# Patient Record
Sex: Female | Born: 1960 | Race: White | Hispanic: No | Marital: Married | Smoking: Former smoker
Health system: Southern US, Community
[De-identification: ages and names within clinical notes are randomized; demographics above are authoritative.]

## PROBLEM LIST (undated history)

## (undated) DIAGNOSIS — N390 Urinary tract infection, site not specified: Secondary | ICD-10-CM

## (undated) DIAGNOSIS — F329 Major depressive disorder, single episode, unspecified: Secondary | ICD-10-CM

## (undated) DIAGNOSIS — F32A Depression, unspecified: Secondary | ICD-10-CM

## (undated) DIAGNOSIS — K219 Gastro-esophageal reflux disease without esophagitis: Secondary | ICD-10-CM

## (undated) DIAGNOSIS — E785 Hyperlipidemia, unspecified: Secondary | ICD-10-CM

## (undated) HISTORY — DX: Gastro-esophageal reflux disease without esophagitis: K21.9

## (undated) HISTORY — DX: Major depressive disorder, single episode, unspecified: F32.9

## (undated) HISTORY — DX: Hyperlipidemia, unspecified: E78.5

## (undated) HISTORY — DX: Depression, unspecified: F32.A

---

## 1986-07-23 HISTORY — PX: BIOPSY BREAST: PRO8

## 2000-07-23 HISTORY — PX: CHOLECYSTECTOMY: SHX55

## 2014-01-16 ENCOUNTER — Encounter (HOSPITAL_COMMUNITY): Payer: Self-pay | Admitting: Emergency Medicine

## 2014-01-16 ENCOUNTER — Emergency Department (INDEPENDENT_AMBULATORY_CARE_PROVIDER_SITE_OTHER)
Admission: EM | Admit: 2014-01-16 | Discharge: 2014-01-16 | Disposition: A | Discharge status: Home / Self Care | Payer: BC Managed Care – PPO | Source: Home / Self Care | Attending: Emergency Medicine | Admitting: Emergency Medicine

## 2014-01-16 DIAGNOSIS — M533 Sacrococcygeal disorders, not elsewhere classified: Secondary | ICD-10-CM

## 2014-01-16 HISTORY — DX: Urinary tract infection, site not specified: N39.0

## 2014-01-16 MED ORDER — PREDNISONE 10 MG PO TABS: ORAL_TABLET | ORAL | Status: DC

## 2014-01-16 MED ORDER — METHYLPREDNISOLONE ACETATE 80 MG/ML IJ SUSP
INTRAMUSCULAR | Status: AC
  Filled 2014-01-16: qty 1

## 2014-01-16 MED ORDER — METHYLPREDNISOLONE ACETATE 80 MG/ML IJ SUSP
80.0000 mg | Freq: Once | INTRAMUSCULAR | Status: AC
  Administered 2014-01-16: 80 mg via INTRAMUSCULAR

## 2014-01-16 MED ORDER — HYDROCODONE-ACETAMINOPHEN 5-325 MG PO TABS: ORAL_TABLET | ORAL | Status: DC

## 2014-01-16 MED ORDER — DICLOFENAC SODIUM 75 MG PO TBEC: 75.0000 mg | DELAYED_RELEASE_TABLET | Freq: Two times a day (BID) | ORAL | Status: DC

## 2014-01-16 NOTE — ED Notes (Signed)
Denies injury.  C/O coccyx pain x 2 days with progressive worsening.  Has been sitting more than usual.  States moving from sitting to standing position is excruciating.  Has been applying heat and taking IBU.  Having difficulty sleeping due to pain.

## 2014-01-16 NOTE — Discharge Instructions (Signed)
Tailbone Injury  The tailbone (coccyx) is the small bone at the lower end of the spine. A tailbone injury may involve stretched ligaments, bruising, or a broken bone (fracture). Women are more vulnerable to this injury due to having a wider pelvis.  CAUSES   This type of injury typically occurs from falling and landing on the tailbone. Repeated strain or friction from actions such as rowing and bicycling may also injure the area. The tailbone can be injured during childbirth. Infections or tumors may also press on the tailbone and cause pain. Sometimes, the cause of injury is unknown.  SYMPTOMS    Bruising.   Pain when sitting.   Painful bowel movements.   In women, pain during intercourse.  DIAGNOSIS   Your caregiver can diagnose a tailbone injury based on your symptoms and a physical exam. X-rays may be taken if a fracture is suspected. Your caregiver may also use an MRI scan imaging test to evaluate your symptoms.  TREATMENT   Your caregiver may prescribe medicines to help relieve your pain. Most tailbone injuries heal on their own in 4 to 6 weeks. However, if the injury is caused by an infection or tumor, the recovery period may vary.  PREVENTION   Wear appropriate padding and sports gear when bicycling and rowing. This can help prevent an injury from repeated strain or friction.  HOME CARE INSTRUCTIONS    Put ice on the injured area.   Put ice in a plastic bag.   Place a towel between your skin and the bag.   Leave the ice on for 15-20 minutes, every hour while awake for the first 1 to 2 days.   Sit on a large, rubber or inflated ring or cushion to ease your pain. Lean forward when sitting to help decrease discomfort.   Avoid sitting for long periods of time.   Increase your activity as the pain allows.   Only take over-the-counter or prescription medicines for pain, discomfort, or fever as directed by your caregiver.   You may use stool softeners if it is painful to have a bowel movement, or as  directed by your caregiver.   Eat a diet with plenty of fiber to help prevent constipation.   Keep all follow-up appointments as directed by your caregiver.  SEEK MEDICAL CARE IF:    Your pain becomes worse.   Your bowel movements cause a great deal of discomfort.   You are unable to have a bowel movement.   You have a fever.  MAKE SURE YOU:   Understand these instructions.   Will watch your condition.   Will get help right away if you are not doing well or get worse.  Document Released: 07/06/2000 Document Revised: 10/01/2011 Document Reviewed: 02/01/2011  ExitCare Patient Information 2015 [employer]. This information is not intended to replace advice given to you by your health care provider. Make sure you discuss any questions you have with your health care provider.

## 2014-01-16 NOTE — ED Provider Notes (Signed)
Chief Complaint   Chief Complaint  Patient presents with  . Tailbone Pain    History of Present Illness   Noreene LarssonJanet Adinolfi is a 53 year old female has had a two-day history of pain in her lower back localized around the coccyx area. She denies any injury to this area. There is no swelling or erythema. No palpable mass. The pain radiates out to either side but not down the legs. There is no numbness, tingling, weakness in lower extremities. She denies any urinary symptoms, bladder or bowel dysfunction, or saddle anesthesia. The pain is worse with sitting. She gets relief if she sits on a pillow. She does have some pain with bending but not severe. She denies any abdominal pain, nausea, or vomiting. She's had no fever, chills, or weight loss.  Review of Systems   Other than as noted above, the patient denies any of the following symptoms: Systemic:  No fever, chills, or unexplained weight loss. GI:  No abdominal painor incontinence of bowel. GU:  No dysuria, frequency, urgency, or hematuria. No incontinence of urine or urinary retention.  M-S:  No neck pain or arthritis. Neuro:  No paresthesias, headache, saddle anesthesia, muscular weakness, or progressive neurological deficit.  PMFSH   Past medical history, family history, social history, meds, and allergies were reviewed. Specifically, there is no history of cancer, major trauma, osteoporosis, immunosuppression, or HIV infection.   Physical Examination    Vital signs:  BP 131/65  Pulse 72  Temp(Src) 98.8 F (37.1 C) (Oral)  Resp 16  SpO2 100% General:  Alert, oriented, in no distress. Abdomen:  Soft, non-tender.  No organomegaly or mass.  No pulsatile midline abdominal mass or bruit. Back:  There is pain to palpation over the coccyx. No swelling or erythema. No evidence of an abscess. Her back has a full range of motion with slight pain on forward bending. Straight leg raising is negative. Neuro:  Normal muscle strength, sensations  and DTRs. Extremities: Pedal pulses were full, there was no edema. Skin:  Clear, warm and dry.  No rash.  Assessment   The encounter diagnosis was Coccygodynia.  No evidence of cauda equina syndrome, discitis, epidural abscess, or aneurism.    Plan     1.  Meds:  The following meds were prescribed:   Discharge Medication List as of 01/16/2014  6:31 PM    START taking these medications   Details  diclofenac (VOLTAREN) 75 MG EC tablet Take 1 tablet (75 mg total) by mouth 2 (two) times daily., Starting 01/16/2014, Until Discontinued, Normal    HYDROcodone-acetaminophen (NORCO/VICODIN) 5-325 MG per tablet 1 to 2 tabs every 4 to 6 hours as needed for pain., Print    predniSONE (DELTASONE) 10 MG tablet Take 4 tabs daily for 4 days, 3 tabs daily for 4 days, 2 tabs daily for 4 days, then 1 tab daily for 4 days., Normal        2.  Patient Education/Counseling:  The patient was given appropriate handouts, self care instructions, and instructed in symptomatic relief. The patient was encouraged to try to be as active as possible and given some exercises to do followed by moist heat. Suggested sitting on a foam rubber donut or inflatable doughnut or a soft pillow. Followup with Dr. Magnus IvanBlackman if no better by next week.  3.  Follow up:  The patient was told to follow up here if no better in 3 to 4 days, or sooner if becoming worse in any way, and given some  red flag symptoms such as worsening pain or new neurological symptoms which would prompt immediate return.      Reuben Likesavid C Keller, MD 01/16/14 [phone]

## 2014-04-14 ENCOUNTER — Ambulatory Visit: Payer: BC Managed Care – PPO

## 2014-04-15 ENCOUNTER — Ambulatory Visit (INDEPENDENT_AMBULATORY_CARE_PROVIDER_SITE_OTHER): Payer: BC Managed Care – PPO | Admitting: Emergency Medicine

## 2014-04-15 VITALS — BP 125/79 | HR 72 | Temp 97.9°F | Resp 16 | Ht 64.0 in | Wt 154.2 lb

## 2014-04-15 DIAGNOSIS — L240 Irritant contact dermatitis due to detergents: Secondary | ICD-10-CM

## 2014-04-15 MED ORDER — TRIAMCINOLONE ACETONIDE 0.1 % EX CREA: 1.0000 "application " | TOPICAL_CREAM | Freq: Two times a day (BID) | CUTANEOUS | Status: DC

## 2014-04-15 NOTE — Progress Notes (Signed)
[facility] [address], [place] [address] [phone]  Date:  04/15/2014   Name:  Angela Pittman   DOB:  [birth year]   MRN:  [id number]  PCP:  No PCP Per Patient    Chief Complaint: Rash and Edema   History of Present Illness:  Angela Pittman is a 53 y.o. very pleasant female patient who presents with the following:  Over the past two weeks she has been exposed to two types of gloves that she wears intermittently.   She uses a detergent and a skin sanitizer that seh is intermittently exposed to . She has now red vesicular rash on the dorsum of the hands and minimally on the cheeks No systemic allergic symptoms. No improvement with over the counter medications or other home remedies.      There are no active problems to display for this patient.   Past Medical History  Diagnosis Date  . UTI (lower urinary tract infection)     Past Surgical History  Procedure Laterality Date  . Cholecystectomy      History  Substance Use Topics  . Smoking status: Never Smoker   . Smokeless tobacco: Not on file  . Alcohol Use: No    No family history on file.  Allergies  Allergen Reactions  . Ciprofloxacin   . Sulfa Antibiotics     Medication list has been reviewed and updated.  No current outpatient prescriptions on file prior to visit.   No current facility-administered medications on file prior to visit.    Review of Systems:  As per HPI, otherwise negative.    Physical Examination: Filed Vitals:   04/15/14 0950  BP: 125/79  Pulse: 72  Temp: 97.9 F (36.6 C)  Resp: 16   Filed Vitals:   04/15/14 0950  Height:  (1.626 m)  Weight: 154 lb 3.2 oz (69.945 kg)   Body mass index is 26.46 kg/(m^2). Ideal Body Weight: Weight in (lb) to have BMI = 25: 145.3   GEN: WDWN, NAD, Non-toxic, Alert & Oriented x 3 HEENT: Atraumatic, Normocephalic.  Ears and Nose: No external deformity. EXTR: No clubbing/cyanosis/edema NEURO: Normal  gait.  PSYCH: Normally interactive. Conversant. Not depressed or anxious appearing.  Calm demeanor.  SKIN:  Bilateral dorsal erythematous vesicular edematous eruption on the hands.   Some erythema on the face.  Assessment and Plan: Contact dermatitis from contact with a new product Elimination plan TAC  Signed,  Phillips Odor, MD

## 2014-04-15 NOTE — Patient Instructions (Signed)

## 2014-10-04 HISTORY — PX: BUNIONECTOMY: SHX129

## 2014-10-05 ENCOUNTER — Ambulatory Visit: Payer: BC Managed Care – PPO | Admitting: Internal Medicine

## 2014-11-10 ENCOUNTER — Ambulatory Visit: Payer: Self-pay | Admitting: Internal Medicine

## 2014-12-23 ENCOUNTER — Ambulatory Visit (INDEPENDENT_AMBULATORY_CARE_PROVIDER_SITE_OTHER): Payer: Managed Care, Other (non HMO) | Admitting: Internal Medicine

## 2014-12-23 ENCOUNTER — Encounter: Payer: Self-pay | Admitting: Internal Medicine

## 2014-12-23 VITALS — BP 110/70 | Temp 98.3°F | Ht 63.75 in | Wt 135.4 lb

## 2014-12-23 DIAGNOSIS — F32A Depression, unspecified: Secondary | ICD-10-CM | POA: Insufficient documentation

## 2014-12-23 DIAGNOSIS — K219 Gastro-esophageal reflux disease without esophagitis: Secondary | ICD-10-CM | POA: Diagnosis not present

## 2014-12-23 DIAGNOSIS — F329 Major depressive disorder, single episode, unspecified: Secondary | ICD-10-CM

## 2014-12-23 DIAGNOSIS — E785 Hyperlipidemia, unspecified: Secondary | ICD-10-CM | POA: Insufficient documentation

## 2014-12-23 DIAGNOSIS — Z1211 Encounter for screening for malignant neoplasm of colon: Secondary | ICD-10-CM | POA: Insufficient documentation

## 2014-12-23 NOTE — Progress Notes (Signed)
Pre visit review using our clinic review tool, if applicable. No additional management support is needed unless otherwise documented below in the visit note.  Chief Complaint  Patient presents with  . Establish Care    HPI: Patient  Angela Pittman  54 y.o. married female previously from [place] he was moved in the last year who comes in today for new patient Health Care visit  PCP and health care team : Previously was in [place]. She had an OB/GYN their psychiatrist and psychologist. She recently had a bunion surgery in [place] recovering since March. She generally feels well and has a couple of medical conditions that require management  Sleep : melatonin   use difficulties  taking Ambien because gets side effects sleep disruption possibly related to the surgery.   Nanny and mange ing. Moved  From [place]  LIPIDS :   Lost weight  And on med . For this her original cholesterol was About 250  obgyne amy groff.  In [place] we'll need to set up a new one concern about breast cancer risk Sis  Passed age 60  Mgm  so died of breast cancer some cousins had early breast cancer Depression : was seeing someone in [place]  Last seen 8  Months ago  On wellbutrin helps .  Counselor and  Medication.   Gets yearly skin check. GERD takes Protonix mostly once a day although it is written twice a day had her esophagus stretched a number of years ago has been on this medicine for about 10 years. Wonders if she needs another endoscopy. Health Maintenance  Topic Date Due  . TETANUS/TDAP  04/15/1979  . HIV Screening  12/22/2015 (Originally 04/15/1975)  . INFLUENZA VACCINE  02/21/2015  . COLONOSCOPY  07/24/2015  . MAMMOGRAM  08/26/2016  . PAP SMEAR  08/26/2017   Health Maintenance Review LIFESTYLE:  Exercise:  Does usually yoga and is active but less since her foot surgery Tobacco/ETS: Emote history 70 years Alcohol: No  Sugar beverages: Avoids Sleep: X hours Drug use: no Colonoscopy: Due for  this PAP: Up-to-date 2 Dec 21, 2014 gravida 1 para 0 MAMMO: 2 12-21-2014   ROS:  GEN/ HEENT: No fever, significant weight changes sweats headaches vision problems hearing changes, CV/ PULM; No chest pain shortness of breath cough, syncope,edema  change in exercise tolerance. GI /GU: No adominal pain, vomiting, change in bowel habits. No blood in the stool. No significant GU symptoms. SKIN/HEME: ,no acute skin rashes suspicious lesions or bleeding. No lymphadenopathy, nodules, masses.  NEURO/ PSYCH:  No neurologic signs such as weakness numbness. No depression anxiety. IMM/ Allergy: No unusual infections.  Allergy .   REST of 12 system review negative except as per HPI   Past Medical History  Diagnosis Date  . UTI (lower urinary tract infection)   . Depression     on meds under care stable psych  and counserlor [place]  . 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 Hyperlipidemia     on meds and weight loss help / orig 250 and above reange by report    Past Surgical History  Procedure Laterality Date  . Cholecystectomy  2000/12/20  . Bunionectomy  10/04/14  . Biopsy breast  1988    Family History  Problem Relation Age of Onset  . Breast cancer Sister     Deceased 12/20/04 age 80  . Lung cancer Father     mets to brain  .  Hyperlipidemia Mother   . Alzheimer's disease Mother   . Heart disease Father     Heart attack  . Hypertension Mother   . Hypertension Brother     History   Social History  . Marital Status: Married    Spouse Name: N/A  . Number of Children: N/A  . Years of Education: N/A   Social History Main Topics  . Smoking status: Former Games developer  . Smokeless tobacco: Never Used  . Alcohol Use: 0.0 oz/week    0 Standard drinks or equivalent per week  . Drug Use: No  . Sexual Activity:    Partners: Male     Comment: Husband   Other Topics Concern  . 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   Recently moved from [facility]   No Children   Gravida 1 para 0    Outpatient Prescriptions Prior to Visit  Medication Sig Dispense Refill  . atorvastatin (LIPITOR) 40 MG tablet Take 40 mg by mouth daily.    . pantoprazole (PROTONIX) 40 MG tablet Take 40 mg by mouth 2 (two) times daily.     . valACYclovir (VALTREX) 500 MG tablet Take 500 mg by mouth 2 (two) times daily.    . BuPROPion HCl (WELLBUTRIN PO) Take 150 mg by mouth 3 (three) times daily.    Marland Kitchen triamcinolone cream (KENALOG) 0.1 % Apply 1 application topically 2 (two) times daily. 30 g 0   No facility-administered medications prior to visit.     EXAM:  BP 110/70 mmHg  Temp(Src) 98.3 F (36.8 C) (Oral)  Ht 5' 3.75" (1.619 m)  Wt 135 lb 6.4 oz (61.417 kg)  BMI 23.43 kg/m2  Body mass index is 23.43 kg/(m^2).  Physical Exam: Vital signs reviewed LOV:FIEP is a well-developed well-nourished alert cooperative    who appearsr stated age in no acute distress. Some excess motor activity looks well no tremor fidgeting. HEENT: normocephalic atraumatic , Eyes: PERRL EOM's full, conjunctiva clear, Nares: paten,t no deformity discharge or tenderness.,  NECK: supple without masses, thyromegaly or bruits. CHEST/PULM:  Clear to auscultation and percussion breath sounds equal no wheeze , rales or rhonchi. CV: PMI is nondisplaced, S1 S2 no gallops, murmurs, rubs. Peripheral pulses are full without delay.No JVD .  ABDOMEN: Bowel sounds normal nontender  No guard or rebound, no hepato splenomegal no CVA tenderness.   Extremtities:  No clubbing cyanosis or edema, no acute joint swelling or redness no focal atrophy NEURO:  Oriented x3, cranial nerves 3-12 appear to be intact, no obvious focal weakness,gait within normal limits no abnormal reflexes or asymmetrical SKIN: No acute rashes normal turgor, color, no bruising  or petechiae. PSYCH: Oriented, good eye contact, no obvious depression anxiety, cognition and judgment appear normal. LN: no cervical  adenopathy    No results found for: WBC, HGB, HCT, PLT, GLUCOSE, CHOL, TRIG, HDL, LDLDIRECT, LDLCALC, ALT, AST, NA, K, CL, CREATININE, BUN, CO2, TSH, PSA, INR, GLUF, HGBA1C, MICROALBUR  ASSESSMENT AND PLAN:  Discussed the following assessment and plan:  Gastroesophageal reflux disease, esophagitis presence not specified - On chronic PPI history of esophageal procedure refer to GI uncertain if she really needs an endoscopy or just: Screening - Plan: Ambulatory referral to Gastroenterology  Hyperlipidemia - Plan check in this near future with routine  labs  Depression - On high-dose Wellbutrin from psychiatrist to continue transfer to local care  Screen for colon cancer - Is due for this will refer - Plan: Ambulatory referral to Gastroenterology  Patient Care Team: Madelin HeadingsWanda K Panosh, MD as PCP - General (Internal Medicine) Richmond CampbellGroff, Amy (Obstetrics and Gynecology) Larey DresserMartha Ajlouny, DPM as Consulting Physician (Podiatry) Patient Instructions   will be contacted about colonoscopy screening referral. [facility], Dr. Loralie ChampagneMcKinney's office for behavioral health Advise we set you up for a preventive care visit checkup was full set of labs including cholesterol blood sugar and thyroid. No change in medicines at this time.       Neta MendsWanda K. Panosh M.D.

## 2014-12-23 NOTE — Patient Instructions (Signed)
will be contacted about colonoscopy screening referral. [facility], Dr. Loralie ChampagneMcKinney's office for behavioral health Advise we set you up for a preventive care visit checkup was full set of labs including cholesterol blood sugar and thyroid. No change in medicines at this time.

## 2015-01-03 ENCOUNTER — Telehealth: Payer: Self-pay

## 2015-01-03 NOTE — Telephone Encounter (Signed)
Done - pt. reported done on 08-26-14

## 2015-06-27 ENCOUNTER — Telehealth: Payer: Self-pay | Admitting: Internal Medicine

## 2015-06-27 ENCOUNTER — Other Ambulatory Visit (INDEPENDENT_AMBULATORY_CARE_PROVIDER_SITE_OTHER): Payer: Managed Care, Other (non HMO)

## 2015-06-27 DIAGNOSIS — Z0000 Encounter for general adult medical examination without abnormal findings: Secondary | ICD-10-CM | POA: Diagnosis not present

## 2015-06-27 LAB — TSH: TSH: 2.81 u[IU]/mL (ref 0.35–4.50)

## 2015-06-27 LAB — CBC WITH DIFFERENTIAL/PLATELET
BASOS ABS: 0 10*3/uL (ref 0.0–0.1)
Basophils Relative: 0.5 % (ref 0.0–3.0)
EOS ABS: 0.1 10*3/uL (ref 0.0–0.7)
EOS PCT: 2.5 % (ref 0.0–5.0)
HCT: 41.6 % (ref 36.0–46.0)
HEMOGLOBIN: 13.9 g/dL (ref 12.0–15.0)
Lymphocytes Relative: 35.5 % (ref 12.0–46.0)
Lymphs Abs: 1.5 10*3/uL (ref 0.7–4.0)
MCHC: 33.4 g/dL (ref 30.0–36.0)
MCV: 92.9 fl (ref 78.0–100.0)
MONO ABS: 0.4 10*3/uL (ref 0.1–1.0)
Monocytes Relative: 9.2 % (ref 3.0–12.0)
NEUTROS PCT: 52.3 % (ref 43.0–77.0)
Neutro Abs: 2.2 10*3/uL (ref 1.4–7.7)
Platelets: 334 10*3/uL (ref 150.0–400.0)
RBC: 4.48 Mil/uL (ref 3.87–5.11)
RDW: 13.2 % (ref 11.5–15.5)
WBC: 4.3 10*3/uL (ref 4.0–10.5)

## 2015-06-27 LAB — HEPATIC FUNCTION PANEL
ALK PHOS: 58 U/L (ref 39–117)
ALT: 15 U/L (ref 0–35)
AST: 18 U/L (ref 0–37)
Albumin: 4.3 g/dL (ref 3.5–5.2)
BILIRUBIN DIRECT: 0.1 mg/dL (ref 0.0–0.3)
Total Bilirubin: 0.4 mg/dL (ref 0.2–1.2)
Total Protein: 7 g/dL (ref 6.0–8.3)

## 2015-06-27 LAB — LIPID PANEL
CHOL/HDL RATIO: 3
Cholesterol: 163 mg/dL (ref 0–200)
HDL: 64.1 mg/dL (ref >39.00)
LDL CALC: 88 mg/dL (ref 0–99)
NONHDL: 99.19
Triglycerides: 55 mg/dL (ref 0.0–149.0)
VLDL: 11 mg/dL (ref 0.0–40.0)

## 2015-06-27 LAB — BASIC METABOLIC PANEL
BUN: 12 mg/dL (ref 6–23)
CALCIUM: 9.7 mg/dL (ref 8.4–10.5)
CO2: 30 mEq/L (ref 19–32)
Chloride: 104 mEq/L (ref 96–112)
Creatinine, Ser: 0.72 mg/dL (ref 0.40–1.20)
GFR: 89.32 mL/min (ref >60.00)
Glucose, Bld: 83 mg/dL (ref 70–99)
POTASSIUM: 4.9 meq/L (ref 3.5–5.1)
Sodium: 140 mEq/L (ref 135–145)

## 2015-06-27 NOTE — Telephone Encounter (Signed)
[facility] her right [facility], right hip/back for pain that comes and goes, aggravated more by standing and exercising.    Patient also needs a refill on her Atorvastatin.  She said if an authorization isn't called over it won't go through.  She thought she was seeing Dr. Fabian SharpPanosh today but her visit was for blood work.  She has a physical on December 12th  Pharmacy is [facility] on [address].

## 2015-06-27 NOTE — Telephone Encounter (Signed)
I do not see where WP has prescribed atorvastatin.  Will forward for authorization. Does she need ov for referral?

## 2015-06-27 NOTE — Telephone Encounter (Signed)
Ok to refill the atorvastatin for 90 days   She is a newer patient and this is a continuation of previous med .    Usually dont  need a referral to see ortho    But if she does  We can discuss at ov cpx .  And refer as appropriate

## 2015-06-28 MED ORDER — ATORVASTATIN CALCIUM 40 MG PO TABS: 40.0000 mg | ORAL_TABLET | Freq: Every day | ORAL | Status: DC

## 2015-06-28 NOTE — Telephone Encounter (Signed)
Pt notified that rx has been sent to the pharmacy.  Advised that referral can be discussed at her cpx on 07/04/15.  Pt stated her insurance does require authorization.  Pt also asking about lab results.  Informed her that results will be discussed during cpx visit.  No further questions at this time.

## 2015-07-04 ENCOUNTER — Encounter: Payer: Self-pay | Admitting: Internal Medicine

## 2015-07-04 ENCOUNTER — Ambulatory Visit (INDEPENDENT_AMBULATORY_CARE_PROVIDER_SITE_OTHER): Payer: Managed Care, Other (non HMO) | Admitting: Internal Medicine

## 2015-07-04 VITALS — BP 120/72 | Temp 98.4°F | Ht 64.0 in | Wt 131.0 lb

## 2015-07-04 DIAGNOSIS — F32A Depression, unspecified: Secondary | ICD-10-CM

## 2015-07-04 DIAGNOSIS — K219 Gastro-esophageal reflux disease without esophagitis: Secondary | ICD-10-CM

## 2015-07-04 DIAGNOSIS — F329 Major depressive disorder, single episode, unspecified: Secondary | ICD-10-CM | POA: Diagnosis not present

## 2015-07-04 DIAGNOSIS — Z79899 Other long term (current) drug therapy: Secondary | ICD-10-CM

## 2015-07-04 DIAGNOSIS — Z0000 Encounter for general adult medical examination without abnormal findings: Secondary | ICD-10-CM

## 2015-07-04 DIAGNOSIS — M79604 Pain in right leg: Secondary | ICD-10-CM

## 2015-07-04 DIAGNOSIS — E785 Hyperlipidemia, unspecified: Secondary | ICD-10-CM

## 2015-07-04 DIAGNOSIS — Z2821 Immunization not carried out because of patient refusal: Secondary | ICD-10-CM

## 2015-07-04 DIAGNOSIS — R1031 Right lower quadrant pain: Secondary | ICD-10-CM

## 2015-07-04 MED ORDER — PANTOPRAZOLE SODIUM 40 MG PO TBEC
40.0000 mg | DELAYED_RELEASE_TABLET | Freq: Every day | ORAL | Status: AC
Start: 2015-07-04 — End: ?

## 2015-07-04 NOTE — Progress Notes (Signed)
Pre visit review using our clinic review tool, if applicable. No additional management support is needed unless otherwise documented below in the visit note.  Chief Complaint  Patient presents with  . Annual Exam    HPI: Patient  Angela Pittman  54 y.o. comes in today for Preventive Health Care visit . gerd  taking once a day  Block on refill  Written as poss 2 per day  depressoin med Check skin see below  Right groin pain for months after  bunion surgery on left  Mom is hospice terminal alzheimers  St an congesti n cough no sob  Health Maintenance  Topic Date Due  . TETANUS/TDAP  04/15/1979  . INFLUENZA VACCINE  07/03/2016 (Originally 02/21/2015)  . Hepatitis C Screening  07/03/2016 (Originally [birth year])  . HIV Screening  07/03/2016 (Originally 04/15/1975)  . COLONOSCOPY  07/24/2015  . MAMMOGRAM  08/26/2016  . PAP SMEAR  08/26/2017   Health Maintenance Review LIFESTYLE:  Exercise:   Yoga and walk  Tobacco/ETS:  no Alcohol:   ocass  2 per week  Sugar beverages: no Sleep:  Mom in hospice  care     Some problem from hyper  Drug use: no    ROS:  Throat drainage? Clear ing no sob  Reflux per se no dysphagia  Skin left lat face  ? Check change  GEN/ HEENT: No fever, significant weight changes sweats headaches vision problems hearing changes, CV/ PULM; No chest pain shortness of breath cough, syncope,edema  change in exercise tolerance. GI /GU: No adominal pain, vomiting, change in bowel habits. No blood in the stool. No significant GU symptoms. SKIN/HEME: ,no acute skin rashes suspicious lesions or bleeding. No lymphadenopathy, nodules, masses.  NEURO/ PSYCH:  No neurologic signs such as weakness numbness. No depression anxiety. IMM/ Allergy: No unusual infections.  Allergy .   REST of 12 system review negative except as per HPI   Past Medical History  Diagnosis Date  . UTI (lower urinary tract infection)   . Depression     on meds under care stable psych  and counserlor  [place]  . 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 Hyperlipidemia     on meds and weight loss help / orig 250 and above reange by report    Past Surgical History  Procedure Laterality Date  . Cholecystectomy  06-30-01  . Bunionectomy  10/04/14  . Biopsy breast  1988    Family History  Problem Relation Age of Onset  . Breast cancer Sister     Deceased Jun 30, 2005 age 21  . Lung cancer Father     mets to brain  . Hyperlipidemia Mother   . Alzheimer's disease Mother   . Heart disease Father     Heart attack  . Hypertension Mother   . Hypertension Brother     Social History   Social History  . Marital Status: Married    Spouse Name: N/A  . Number of Children: N/A  . Years of Education: N/A   Social History Main Topics  . Smoking status: Former Games developer  . Smokeless tobacco: Never Used  . Alcohol Use: 0.0 oz/week    0 Standard drinks or equivalent per week  . Drug Use: No  . Sexual Activity:    Partners: Male     Comment: Husband   Other Topics Concern  . 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   [place] from [facility]   No Children   Gravida 1 para 0    Outpatient Prescriptions Prior to Visit  Medication Sig Dispense Refill  . atorvastatin (LIPITOR) 40 MG tablet Take 1 tablet (40 mg total) by mouth daily. 30 tablet 0  . buPROPion (WELLBUTRIN XL) 150 MG 24 hr tablet Take 3 tablets (450 mg total) by mouth daily. 90 tablet 1  . valACYclovir (VALTREX) 500 MG tablet Take 500 mg by mouth 2 (two) times daily.    . pantoprazole (PROTONIX) 40 MG tablet Take 40 mg by mouth 2 (two) times daily.      No facility-administered medications prior to visit.     EXAM:  BP 120/72 mmHg  Temp(Src) 98.4 F (36.9 C) (Oral)  Ht 5\' 4"  (1.626 m)  Wt 131 lb (59.421 kg)  BMI  22.47 kg/m2  Body mass index is 22.47 kg/(m^2).  Physical Exam: Vital signs reviewed NFA:OZHY is a well-developed well-nourished alert cooperative    who appearsr stated age in no acute distress.  HEENT: normocephalic atraumatic , Eyes: PERRL EOM's full, conjunctiva clear, Nares: paten,t no deformity discharge or tenderness., Ears: no deformity EAC's clear TMs with normal landmarks. Mouth: clear OP, no lesions, edema.  Moist mucous membranes. Dentition in adequate repair. NECK: supple without masses, thyromegaly or bruits. CHEST/PULM:  Clear to auscultation and percussion breath sounds equal no wheeze , rales or rhonchi. No chest wall deformities or tenderness.Breast: normal by inspection . No dimpling, discharge, masses, tenderness or discharge . CV: PMI is nondisplaced, S1 S2 no gallops, murmurs, rubs. Peripheral pulses are full without delay.No JVD .  ABDOMEN: Bowel sounds normal nontender  No guard or rebound, no hepato splenomegal no CVA tenderness.  No hernia. Extremtities:  No clubbing cyanosis or edema, no acute joint swelling or redness no focal atrophy groin   Left bunion wellhealed . Dec rom great toe. NEURO:  Oriented x3, cranial nerves 3-12 appear to be intact, no obvious focal weakness,gait within normal limits no abnormal reflexes or asymmetrical SKIN: No acute rashes normal turgor, color, no bruising or petechiae. Left temporal area brown freckling  No scaling or irregualrity  PSYCH: Oriented, good eye contact, no obvious depression anxiety, cognition and judgment appear normal. LN: no cervical axillary inguinal adenopathy  Lab Results  Component Value Date   WBC 4.3 06/27/2015   HGB 13.9 06/27/2015   HCT 41.6 06/27/2015   PLT 334.0 06/27/2015   GLUCOSE 83 06/27/2015   CHOL 163 06/27/2015   TRIG 55.0 06/27/2015   HDL 64.10 06/27/2015   LDLCALC 88 06/27/2015   ALT 15 06/27/2015   AST 18 06/27/2015   NA 140 06/27/2015   K 4.9 06/27/2015   CL 104 06/27/2015   CREATININE  0.72 06/27/2015   BUN 12 06/27/2015   CO2 30 06/27/2015   TSH 2.81 06/27/2015    ASSESSMENT AND PLAN:  Discussed the following assessment and plan:  Visit for preventive health examination  Hyperlipidemia  Medication management  Depression - remission  on med   Gastroesophageal reflux disease, esophagitis presence not specified  Groin pain, right - some rad to knee poss also back  worse with some position yoga no injury  - Plan: Ambulatory referral to Orthopedic Surgery  Influenza vaccination declined  Leg pain, right - ? hip or leg issue  referral ongoing no specific injury  - Plan: Ambulatory referral to Orthopedic Surgery Try flonase  For 2 weeks   For cough  Patient Care Team: Madelin Headings, MD as PCP - General (Internal Medicine) Shara Blazing (Obstetrics and Gynecology) Larey Dresser, DPM as Consulting Physician (Podiatry) Patient Instructions  Continue lifestyle intervention healthy eating and exercise . Have pharmacy contactg [place] about med  Will refer for groiin knee leg pain  As discussed to [place]  conact [place] if need  To rerefer for colonoscopy.   Blood tests are good and normal today  ROV in 6 months for med check     Health Maintenance, Female Adopting a healthy lifestyle and getting preventive care can go a long way to promote health and wellness. Talk with your health care provider about what schedule of regular examinations is right for you. This is a good chance for you to check in with your provider about disease prevention and staying healthy. In between checkups, there are plenty of things you can do on your own. Experts have done a lot of research about which lifestyle changes and preventive measures are most likely to keep you healthy. Ask your health care provider for more information. WEIGHT AND DIET  Eat a healthy diet  Be sure to include plenty of vegetables, fruits, low-fat dairy products, and lean protein.  Do not eat a lot of foods high in solid  fats, added sugars, or salt.  Get regular exercise. This is one of the most important things you can do for your health.  Most adults should exercise for at least 150 minutes each week. The exercise should increase your heart rate and make you sweat (moderate-intensity exercise).  Most adults should also do strengthening exercises at least twice a week. This is in addition to the moderate-intensity exercise.  Maintain a healthy weight  Body mass index (BMI) is a measurement that can be used to identify possible weight problems. It estimates body fat based on height and weight. Your health care provider can help determine your BMI and help you achieve or maintain a healthy weight.  For females 17 years of age and older:   A BMI below 18.5 is considered underweight.  A BMI of 18.5 to 24.9 is normal.  A BMI of 25 to 29.9 is considered overweight.  A BMI of 30 and above is considered obese.  Watch levels of cholesterol and blood lipids  You should start having your blood tested for lipids and cholesterol at 55 years of age, then have this test every 5 years.  You may need to have your cholesterol levels checked more often if:  Your lipid or cholesterol levels are high.  You are older than 55 years of age.  You are at high risk for heart disease.  CANCER SCREENING   Lung Cancer  Lung cancer screening is recommended for adults 34-81 years old who are at high risk for lung cancer because of a history of smoking.  A yearly low-dose CT scan of the lungs is recommended for people who:  Currently smoke.  Have quit within the past 15 years.  Have at least a 30-pack-year history of smoking. A pack year is smoking an average of one pack of cigarettes a day for 1 year.  Yearly screening should continue until it has been 15 years since you quit.  Yearly screening should stop if you develop a health problem that would prevent you from having lung cancer treatment.  Breast  Cancer  Practice breast self-awareness. This means understanding how your breasts normally appear and feel.  It also means doing regular breast self-exams. Let your health care provider know about any changes, no matter how small.  If you are in your 20s or 30s, you should have a clinical breast exam (CBE) by a health care provider every 1-3 years as part of a regular health exam.  If you are 15 or older, have a CBE every year. Also consider having a breast X-ray (mammogram) every year.  If you have a family history of breast cancer, talk to your health care provider about genetic screening.  If you are at high risk for breast cancer, talk to your health care provider about having an MRI and a mammogram every year.  Breast cancer gene (BRCA) assessment is recommended for women who have family members with BRCA-related cancers. BRCA-related cancers include:  Breast.  Ovarian.  Tubal.  Peritoneal cancers.  Results of the assessment will determine the need for genetic counseling and BRCA1 and BRCA2 testing. Cervical Cancer Your health care provider may recommend that you be screened regularly for cancer of the pelvic organs (ovaries, uterus, and vagina). This screening involves a pelvic examination, including checking for microscopic changes to the surface of your cervix (Pap test). You may be encouraged to have this screening done every 3 years, beginning at age 55.  For women ages 38-65, health care providers may recommend pelvic exams and Pap testing every 3 years, or they may recommend the Pap and pelvic exam, combined with testing for human papilloma virus (HPV), every 5 years. Some types of HPV increase your risk of cervical cancer. Testing for HPV may also be done on women of any age with unclear Pap test results.  Other health care providers may not recommend any screening for nonpregnant women who are considered low risk for pelvic cancer and who do not have symptoms. Ask your  health care provider if a screening pelvic exam is right for you.  If you have had past treatment for cervical cancer or a condition that could lead to cancer, you need Pap tests and screening for cancer for at least 20 years after your treatment. If Pap tests have been discontinued, your risk factors (such as having a new sexual partner) need to be reassessed to determine if screening should resume. Some women have medical problems that increase the chance of getting cervical cancer. In these cases, your health care provider may recommend more frequent screening and Pap tests. Colorectal Cancer  This type of cancer can be detected and often prevented.  Routine colorectal cancer screening usually begins at 55 years of age and continues through 55 years of age.  Your health care provider may recommend screening at an earlier age if you have risk factors for colon cancer.  Your health care provider may also recommend using home test kits to check for hidden blood in the stool.  A small camera at the end of a tube can be used to examine your colon directly (sigmoidoscopy or colonoscopy). This is done to check for the earliest forms of colorectal cancer.  Routine screening usually begins at age 63.  Direct examination of the colon should be repeated every 5-10 years through 55 years of age. However, you may need to be screened more often if early forms of precancerous polyps or small growths are found. Skin Cancer  Check your skin from head to toe regularly.  Tell your health care provider about  any new moles or changes in moles, especially if there is a change in a mole's shape or color.  Also tell your health care provider if you have a mole that is larger than the size of a pencil eraser.  Always use sunscreen. Apply sunscreen liberally and repeatedly throughout the day.  Protect yourself by wearing long sleeves, pants, a wide-brimmed hat, and sunglasses whenever you are outside. HEART  DISEASE, DIABETES, AND HIGH BLOOD PRESSURE   High blood pressure causes heart disease and increases the risk of stroke. High blood pressure is more likely to develop in:  People who have blood pressure in the high end of the normal range (130-139/85-89 mm Hg).  People who are overweight or obese.  People who are African American.  If you are 84-51 years of age, have your blood pressure checked every 3-5 years. If you are 30 years of age or older, have your blood pressure checked every year. You should have your blood pressure measured twice--once when you are at a hospital or clinic, and once when you are not at a hospital or clinic. Record the average of the two measurements. To check your blood pressure when you are not at a hospital or clinic, you can use:  An automated blood pressure machine at a pharmacy.  A home blood pressure monitor.  If you are between 39 years and 1 years old, ask your health care provider if you should take aspirin to prevent strokes.  Have regular diabetes screenings. This involves taking a blood sample to check your fasting blood sugar level.  If you are at a normal weight and have a low risk for diabetes, have this test once every three years after 55 years of age.  If you are overweight and have a high risk for diabetes, consider being tested at a younger age or more often. PREVENTING INFECTION  Hepatitis B  If you have a higher risk for hepatitis B, you should be screened for this virus. You are considered at high risk for hepatitis B if:  You were born in a country where hepatitis B is common. Ask your health care provider which countries are considered high risk.  Your parents were born in a high-risk country, and you have not been immunized against hepatitis B (hepatitis B vaccine).  You have HIV or AIDS.  You use needles to inject street drugs.  You live with someone who has hepatitis B.  You have had sex with someone who has hepatitis  B.  You get hemodialysis treatment.  You take certain medicines for conditions, including cancer, organ transplantation, and autoimmune conditions. Hepatitis C  Blood testing is recommended for:  Everyone born from [birth year] through 1966.  Anyone with known risk factors for hepatitis C. Sexually transmitted infections (STIs)  You should be screened for sexually transmitted infections (STIs) including gonorrhea and chlamydia if:  You are sexually active and are younger than 55 years of age.  You are older than 55 years of age and your health care provider tells you that you are at risk for this type of infection.  Your sexual activity has changed since you were last screened and you are at an increased risk for chlamydia or gonorrhea. Ask your health care provider if you are at risk.  If you do not have HIV, but are at risk, it may be recommended that you take a prescription medicine daily to prevent HIV infection. This is called pre-exposure prophylaxis (PrEP). You are considered at  risk if:  You are sexually active and do not regularly use condoms or know the HIV status of your partner(s).  You take drugs by injection.  You are sexually active with a partner who has HIV. Talk with your health care provider about whether you are at high risk of being infected with HIV. If you choose to begin PrEP, you should first be tested for HIV. You should then be tested every 3 months for as long as you are taking PrEP.  PREGNANCY   If you are premenopausal and you may become pregnant, ask your health care provider about preconception counseling.  If you may become pregnant, take 400 to 800 micrograms (mcg) of folic acid every day.  If you want to prevent pregnancy, talk to your health care provider about birth control (contraception). OSTEOPOROSIS AND MENOPAUSE   Osteoporosis is a disease in which the bones lose minerals and strength with aging. This can result in serious bone fractures. Your  risk for osteoporosis can be identified using a bone density scan.  If you are 30 years of age or older, or if you are at risk for osteoporosis and fractures, ask your health care provider if you should be screened.  Ask your health care provider whether you should take a calcium or vitamin D supplement to lower your risk for osteoporosis.  Menopause may have certain physical symptoms and risks.  Hormone replacement therapy may reduce some of these symptoms and risks. Talk to your health care provider about whether hormone replacement therapy is right for you.  HOME CARE INSTRUCTIONS   Schedule regular health, dental, and eye exams.  Stay current with your immunizations.   Do not use any tobacco products including cigarettes, chewing tobacco, or electronic cigarettes.  If you are pregnant, do not drink alcohol.  If you are breastfeeding, limit how much and how often you drink alcohol.  Limit alcohol intake to no more than 1 drink per day for nonpregnant women. One drink equals 12 ounces of beer, 5 ounces of wine, or 1 ounces of hard liquor.  Do not use street drugs.  Do not share needles.  Ask your health care provider for help if you need support or information about quitting drugs.  Tell your health care provider if you often feel depressed.  Tell your health care provider if you have ever been abused or do not feel safe at home.   This information is not intended to replace advice given to you by your health care provider. Make sure you discuss any questions you have with your health care provider.   Document Released: 01/22/2011 Document Revised: 07/30/2014 Document Reviewed: 06/10/2013 Elsevier Interactive Patient Education 2016 [employer].     [place]. Dejanae Helser M.D.

## 2015-07-04 NOTE — Patient Instructions (Addendum)
Continue lifestyle intervention healthy eating and exercise . Have pharmacy contactg [place] about med  Will refer for groiin knee leg pain  As discussed to [place]  conact [place] if need  To rerefer for colonoscopy.   Blood tests are good and normal today  ROV in 6 months for med check     Health Maintenance, Female Adopting a healthy lifestyle and getting preventive care can go a long way to promote health and wellness. Talk with your health care provider about what schedule of regular examinations is right for you. This is a good chance for you to check in with your provider about disease prevention and staying healthy. In between checkups, there are plenty of things you can do on your own. Experts have done a lot of research about which lifestyle changes and preventive measures are most likely to keep you healthy. Ask your health care provider for more information. WEIGHT AND DIET  Eat a healthy diet  Be sure to include plenty of vegetables, fruits, low-fat dairy products, and lean protein.  Do not eat a lot of foods high in solid fats, added sugars, or salt.  Get regular exercise. This is one of the most important things you can do for your health.  Most adults should exercise for at least 150 minutes each week. The exercise should increase your heart rate and make you sweat (moderate-intensity exercise).  Most adults should also do strengthening exercises at least twice a week. This is in addition to the moderate-intensity exercise.  Maintain a healthy weight  Body mass index (BMI) is a measurement that can be used to identify possible weight problems. It estimates body fat based on height and weight. Your health care provider can help determine your BMI and help you achieve or maintain a healthy weight.  For females 53 years of age and older:   A BMI below 18.5 is considered underweight.  A BMI of 18.5 to 24.9 is normal.  A BMI of 25 to 29.9 is considered overweight.  A BMI of 30  and above is considered obese.  Watch levels of cholesterol and blood lipids  You should start having your blood tested for lipids and cholesterol at 55 years of age, then have this test every 5 years.  You may need to have your cholesterol levels checked more often if:  Your lipid or cholesterol levels are high.  You are older than 55 years of age.  You are at high risk for heart disease.  CANCER SCREENING   Lung Cancer  Lung cancer screening is recommended for adults 69-56 years old who are at high risk for lung cancer because of a history of smoking.  A yearly low-dose CT scan of the lungs is recommended for people who:  Currently smoke.  Have quit within the past 15 years.  Have at least a 30-pack-year history of smoking. A pack year is smoking an average of one pack of cigarettes a day for 1 year.  Yearly screening should continue until it has been 15 years since you quit.  Yearly screening should stop if you develop a health problem that would prevent you from having lung cancer treatment.  Breast Cancer  Practice breast self-awareness. This means understanding how your breasts normally appear and feel.  It also means doing regular breast self-exams. Let your health care provider know about any changes, no matter how small.  If you are in your 20s or 30s, you should have a clinical breast exam (CBE)  by a health care provider every 1-3 years as part of a regular health exam.  If you are 40 or older, have a CBE every year. Also consider having a breast X-ray (mammogram) every year.  If you have a family history of breast cancer, talk to your health care provider about genetic screening.  If you are at high risk for breast cancer, talk to your health care provider about having an MRI and a mammogram every year.  Breast cancer gene (BRCA) assessment is recommended for women who have family members with BRCA-related cancers. BRCA-related cancers  include:  Breast.  Ovarian.  Tubal.  Peritoneal cancers.  Results of the assessment will determine the need for genetic counseling and BRCA1 and BRCA2 testing. Cervical Cancer Your health care provider may recommend that you be screened regularly for cancer of the pelvic organs (ovaries, uterus, and vagina). This screening involves a pelvic examination, including checking for microscopic changes to the surface of your cervix (Pap test). You may be encouraged to have this screening done every 3 years, beginning at age 21.  For women ages 30-65, health care providers may recommend pelvic exams and Pap testing every 3 years, or they may recommend the Pap and pelvic exam, combined with testing for human papilloma virus (HPV), every 5 years. Some types of HPV increase your risk of cervical cancer. Testing for HPV may also be done on women of any age with unclear Pap test results.  Other health care providers may not recommend any screening for nonpregnant women who are considered low risk for pelvic cancer and who do not have symptoms. Ask your health care provider if a screening pelvic exam is right for you.  If you have had past treatment for cervical cancer or a condition that could lead to cancer, you need Pap tests and screening for cancer for at least 20 years after your treatment. If Pap tests have been discontinued, your risk factors (such as having a new sexual partner) need to be reassessed to determine if screening should resume. Some women have medical problems that increase the chance of getting cervical cancer. In these cases, your health care provider may recommend more frequent screening and Pap tests. Colorectal Cancer  This type of cancer can be detected and often prevented.  Routine colorectal cancer screening usually begins at 55 years of age and continues through 55 years of age.  Your health care provider may recommend screening at an earlier age if you have risk factors for  colon cancer.  Your health care provider may also recommend using home test kits to check for hidden blood in the stool.  A small camera at the end of a tube can be used to examine your colon directly (sigmoidoscopy or colonoscopy). This is done to check for the earliest forms of colorectal cancer.  Routine screening usually begins at age 50.  Direct examination of the colon should be repeated every 5-10 years through 55 years of age. However, you may need to be screened more often if early forms of precancerous polyps or small growths are found. Skin Cancer  Check your skin from head to toe regularly.  Tell your health care provider about any new moles or changes in moles, especially if there is a change in a mole's shape or color.  Also tell your health care provider if you have a mole that is larger than the size of a pencil eraser.  Always use sunscreen. Apply sunscreen liberally and repeatedly throughout the   day.  Protect yourself by wearing long sleeves, pants, a wide-brimmed hat, and sunglasses whenever you are outside. HEART DISEASE, DIABETES, AND HIGH BLOOD PRESSURE   High blood pressure causes heart disease and increases the risk of stroke. High blood pressure is more likely to develop in:  People who have blood pressure in the high end of the normal range (130-139/85-89 mm Hg).  People who are overweight or obese.  People who are African American.  If you are 18-39 years of age, have your blood pressure checked every 3-5 years. If you are 40 years of age or older, have your blood pressure checked every year. You should have your blood pressure measured twice--once when you are at a hospital or clinic, and once when you are not at a hospital or clinic. Record the average of the two measurements. To check your blood pressure when you are not at a hospital or clinic, you can use:  An automated blood pressure machine at a pharmacy.  A home blood pressure monitor.  If you  are between 55 years and 79 years old, ask your health care provider if you should take aspirin to prevent strokes.  Have regular diabetes screenings. This involves taking a blood sample to check your fasting blood sugar level.  If you are at a normal weight and have a low risk for diabetes, have this test once every three years after 55 years of age.  If you are overweight and have a high risk for diabetes, consider being tested at a younger age or more often. PREVENTING INFECTION  Hepatitis B  If you have a higher risk for hepatitis B, you should be screened for this virus. You are considered at high risk for hepatitis B if:  You were born in a country where hepatitis B is common. Ask your health care provider which countries are considered high risk.  Your parents were born in a high-risk country, and you have not been immunized against hepatitis B (hepatitis B vaccine).  You have HIV or AIDS.  You use needles to inject street drugs.  You live with someone who has hepatitis B.  You have had sex with someone who has hepatitis B.  You get hemodialysis treatment.  You take certain medicines for conditions, including cancer, organ transplantation, and autoimmune conditions. Hepatitis C  Blood testing is recommended for:  Everyone born from 1945 through 1965.  Anyone with known risk factors for hepatitis C. Sexually transmitted infections (STIs)  You should be screened for sexually transmitted infections (STIs) including gonorrhea and chlamydia if:  You are sexually active and are younger than 55 years of age.  You are older than 55 years of age and your health care provider tells you that you are at risk for this type of infection.  Your sexual activity has changed since you were last screened and you are at an increased risk for chlamydia or gonorrhea. Ask your health care provider if you are at risk.  If you do not have HIV, but are at risk, it may be recommended that you  take a prescription medicine daily to prevent HIV infection. This is called pre-exposure prophylaxis (PrEP). You are considered at risk if:  You are sexually active and do not regularly use condoms or know the HIV status of your partner(s).  You take drugs by injection.  You are sexually active with a partner who has HIV. Talk with your health care provider about whether you are at high risk of   being infected with HIV. If you choose to begin PrEP, you should first be tested for HIV. You should then be tested every 3 months for as long as you are taking PrEP.  PREGNANCY   If you are premenopausal and you may become pregnant, ask your health care provider about preconception counseling.  If you may become pregnant, take 400 to 800 micrograms (mcg) of folic acid every day.  If you want to prevent pregnancy, talk to your health care provider about birth control (contraception). OSTEOPOROSIS AND MENOPAUSE   Osteoporosis is a disease in which the bones lose minerals and strength with aging. This can result in serious bone fractures. Your risk for osteoporosis can be identified using a bone density scan.  If you are 65 years of age or older, or if you are at risk for osteoporosis and fractures, ask your health care provider if you should be screened.  Ask your health care provider whether you should take a calcium or vitamin D supplement to lower your risk for osteoporosis.  Menopause may have certain physical symptoms and risks.  Hormone replacement therapy may reduce some of these symptoms and risks. Talk to your health care provider about whether hormone replacement therapy is right for you.  HOME CARE INSTRUCTIONS   Schedule regular health, dental, and eye exams.  Stay current with your immunizations.   Do not use any tobacco products including cigarettes, chewing tobacco, or electronic cigarettes.  If you are pregnant, do not drink alcohol.  If you are breastfeeding, limit how  much and how often you drink alcohol.  Limit alcohol intake to no more than 1 drink per day for nonpregnant women. One drink equals 12 ounces of beer, 5 ounces of wine, or 1 ounces of hard liquor.  Do not use street drugs.  Do not share needles.  Ask your health care provider for help if you need support or information about quitting drugs.  Tell your health care provider if you often feel depressed.  Tell your health care provider if you have ever been abused or do not feel safe at home.   This information is not intended to replace advice given to you by your health care provider. Make sure you discuss any questions you have with your health care provider.   Document Released: 01/22/2011 Document Revised: 07/30/2014 Document Reviewed: 06/10/2013 Elsevier Interactive Patient Education 2016 Elsevier Inc.  

## 2015-08-07 ENCOUNTER — Other Ambulatory Visit: Payer: Self-pay | Admitting: Internal Medicine

## 2015-08-08 NOTE — Telephone Encounter (Signed)
Sent to the pharmacy by e-scribe.  Pt has upcoming appt on 01/02/16 

## 2015-08-24 ENCOUNTER — Telehealth: Payer: Self-pay | Admitting: Internal Medicine

## 2015-08-24 NOTE — Telephone Encounter (Signed)
Needs OV .  Alternative have the original prescriber refill. If needed

## 2015-08-24 NOTE — Telephone Encounter (Signed)
Need more information.  What strength of medication?  What are the directions on the bottle?

## 2015-08-24 NOTE — Telephone Encounter (Signed)
Patient would like to have her Xanex medication refilled that the Urgent Care on [address] in [place] prescribed because she recently lost her mother in death and she is having anxiety attacks and cannot sleep.

## 2015-08-24 NOTE — Telephone Encounter (Signed)
The medication is Alprazolam 0.5mg .  Take a half to one tablet by mouth 3 times a day as needed.  She said to send it to the [facility] on [address].

## 2015-08-24 NOTE — Telephone Encounter (Signed)
Please help to schedule appt.

## 2015-08-24 NOTE — Telephone Encounter (Signed)
Made patient an appt for tomorrow morning at 10:15am

## 2015-08-25 ENCOUNTER — Ambulatory Visit (INDEPENDENT_AMBULATORY_CARE_PROVIDER_SITE_OTHER): Payer: Managed Care, Other (non HMO) | Admitting: Internal Medicine

## 2015-08-25 ENCOUNTER — Encounter: Payer: Self-pay | Admitting: Internal Medicine

## 2015-08-25 VITALS — BP 118/74 | Temp 98.5°F | Wt 132.4 lb

## 2015-08-25 DIAGNOSIS — Z634 Disappearance and death of family member: Secondary | ICD-10-CM

## 2015-08-25 DIAGNOSIS — F430 Acute stress reaction: Principal | ICD-10-CM

## 2015-08-25 DIAGNOSIS — F329 Major depressive disorder, single episode, unspecified: Secondary | ICD-10-CM

## 2015-08-25 DIAGNOSIS — F410 Panic disorder [episodic paroxysmal anxiety] without agoraphobia: Secondary | ICD-10-CM | POA: Diagnosis not present

## 2015-08-25 DIAGNOSIS — Z79899 Other long term (current) drug therapy: Secondary | ICD-10-CM | POA: Diagnosis not present

## 2015-08-25 DIAGNOSIS — F32A Depression, unspecified: Secondary | ICD-10-CM

## 2015-08-25 MED ORDER — ALPRAZOLAM 0.5 MG PO TABS: 0.5000 mg | ORAL_TABLET | Freq: Three times a day (TID) | ORAL | Status: DC | PRN

## 2015-08-25 NOTE — Progress Notes (Signed)
Pre visit review using our clinic review tool, if applicable. No additional management support is needed unless otherwise documented below in the visit note.  Chief Complaint  Patient presents with  . Depression  . Anxiety  . Death of Family Member    HPI: Patient Angela Pittman  comes in today for SDA for  new problem evaluation. See phone note requests alprazolam given to her bu urgent care   After passing of her mom   Bereavement and  [facility].  Help pending that she may be able to see a psychologist psychiatrist with less cost because of her high deductible plan. She will be seeking out hospice counseling also supports. She thought she had pre-grieved her mom's death but it hit her very hard. Mom had Alzheimer's was not there at the moment of death but helped after has minor guilt but feels she did the best she could. She had a very close relationship with her mom.  No past history of panic attacks but had what sounds like hyperventilation with vomiting shortness of breath tingling call the on-call service and they advised seat urgent care. At that time given 20 pills of alprazolam 0.5 to take 3 times a day as needed since that time she is taking a quarter at a time occasionally and it was been quite helpful. She is on Wellbutrin that has helped her for depression in the past not a problem until recently. Has not been on medicine for anxiety per se. Note rare alcohol and one cup of caffeine a day. She is exercising more. ROS: See pertinent positives and negatives per HPI.  Past Medical History  Diagnosis Date  . UTI (lower urinary tract infection)   . Depression     on meds under care stable psych  and counserlor [place]  . 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 Hyperlipidemia     on meds and weight loss help / orig 250 and above reange by report    Family History  Problem Relation Age of Onset  . Breast cancer Sister      Deceased Nov 24, 2004 age 62  . Lung cancer Father     mets to brain  . Hyperlipidemia Mother   . Alzheimer's disease Mother   . Heart disease Father     Heart attack  . Hypertension Mother   . Hypertension Brother     Social History   Social History  . Marital Status: Married    Spouse Name: N/A  . Number of Children: N/A  . Years of Education: N/A   Social History Main Topics  . Smoking status: Former Games developer  . Smokeless tobacco: Never Used  . Alcohol Use: 0.0 oz/week    0 Standard drinks or equivalent per week  . Drug Use: No  . Sexual Activity:    Partners: Male     Comment: Husband   Other Topics Concern  . 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   Recently moved from [facility]   No Children   Gravida 1 Angela 0    Outpatient Prescriptions Prior to Visit  Medication Sig Dispense Refill  . Astaxanthin 4 MG CAPS Take 1 capsule by mouth daily.    Marland Kitchen atorvastatin (LIPITOR) 40  MG tablet TAKE 1 TABLET (40 MG TOTAL) BY MOUTH DAILY. 30 tablet 4  . buPROPion (WELLBUTRIN XL) 150 MG 24 hr tablet Take 3 tablets (450 mg total) by mouth daily. 90 tablet 1  . pantoprazole (PROTONIX) 40 MG tablet Take 1 tablet (40 mg total) by mouth daily. 90 tablet 3  . valACYclovir (VALTREX) 500 MG tablet Take 500 mg by mouth 2 (two) times daily.     No facility-administered medications prior to visit.     EXAM:  BP 118/74 mmHg  Temp(Src) 98.5 F (36.9 C) (Oral)  Wt 132 lb 6.4 oz (60.056 kg)  Body mass index is 22.72 kg/(m^2).  GENERAL: vitals reviewed and listed above, alert, oriented, appears well hydrated and in no acute distress PSYCH: pleasant and cooperative, no obvious depression or anxietylooks tired normal speech no tremor motion limits times appropriate for situation.  ASSESSMENT AND PLAN:  Discussed the following  assessment and plan:  Panic attack as reaction to stress  Death of family member  Medication management  Depression - history of same has been controlled recent loss some increase in symptoms Discussed risk-benefit of medications avoid long-term use of alprazolam however reasonable use at this time refill 24 no refills. Expectant management discussed consideration of SSRI or treatment for panic anxiety if needed however she works through this with her other support and other behavioral health care providers they can take of her medication prescription. Total visit > 50% spent counseling and coordinating care as indicated in above note and in instructions to patient .   -Patient advised to return or notify health care team  if symptoms worsen ,persist or new concerns arise.  Patient Instructions  Agree with   Supports  Bereavement hospice etc .  Can add  As needed alprazolam   If  Ongoing need .   May benefit from controller med for anxiety in addition   Such as zoloft celexa etc.  rov in 1 month if needed.     Neta Mends. Panosh M.D.

## 2015-08-25 NOTE — Patient Instructions (Addendum)
Agree with   Supports  Bereavement hospice etc .  Can add  As needed alprazolam   If  Ongoing need .   May benefit from controller med for anxiety in addition   Such as zoloft celexa etc.  rov in 1 month if needed.

## 2015-09-21 ENCOUNTER — Telehealth: Payer: Self-pay | Admitting: Internal Medicine

## 2015-09-21 NOTE — Telephone Encounter (Signed)
Patient need a refill of a of Alprazolam, she is seeing Sharrie Rothman Therapist, counselor for bereavement and is not able to write prescription.Marland Kitchen

## 2015-09-21 NOTE — Telephone Encounter (Signed)
Can refill x 1   rov in a month  If needs continuing medication

## 2015-09-22 NOTE — Telephone Encounter (Signed)
Patient calling to see if her medication Alprazolam has been sent to her pharmacy  [facility] [phone] [place], [place] - [address] [phone] (Phone) [phone] (Fax)       Please advise

## 2015-09-23 ENCOUNTER — Other Ambulatory Visit: Payer: Self-pay | Admitting: Internal Medicine

## 2015-09-23 NOTE — Telephone Encounter (Signed)
Pharmacy calling to check status of refill as patient is currently at the pharmacy to pick up, verbal given to pharmacy per Dr. Rosezella FloridaPanosh's order.  Also, advised patient needs ov in a month.

## 2015-10-17 ENCOUNTER — Other Ambulatory Visit: Payer: Self-pay | Admitting: Internal Medicine
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
## 2015-10-17 NOTE — Telephone Encounter (Signed)
Last refill 08/25/15 #24 Is it okay to refill?

## 2015-10-18 NOTE — Telephone Encounter (Signed)
Ok x 1

## 2015-10-18 NOTE — Telephone Encounter (Signed)
Pt following up on refill request. Please advise. °

## 2015-10-19 ENCOUNTER — Other Ambulatory Visit: Payer: Self-pay | Admitting: Internal Medicine

## 2015-10-19 MED ORDER — ALPRAZOLAM 0.5 MG PO TABS: 0.5000 mg | ORAL_TABLET | Freq: Three times a day (TID) | ORAL | Status: DC | PRN

## 2015-10-19 NOTE — Telephone Encounter (Signed)
Called to the pharmacy and left on machine. 

## 2015-10-21 NOTE — Telephone Encounter (Signed)
I think we didi this
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
## 2015-11-09 ENCOUNTER — Ambulatory Visit (INDEPENDENT_AMBULATORY_CARE_PROVIDER_SITE_OTHER): Payer: Managed Care, Other (non HMO) | Admitting: Internal Medicine

## 2015-11-09 ENCOUNTER — Encounter: Payer: Self-pay | Admitting: Internal Medicine

## 2015-11-09 VITALS — BP 118/70 | Temp 98.3°F | Wt 132.0 lb

## 2015-11-09 DIAGNOSIS — Z634 Disappearance and death of family member: Secondary | ICD-10-CM | POA: Diagnosis not present

## 2015-11-09 DIAGNOSIS — Z79899 Other long term (current) drug therapy: Secondary | ICD-10-CM

## 2015-11-09 DIAGNOSIS — F410 Panic disorder [episodic paroxysmal anxiety] without agoraphobia: Secondary | ICD-10-CM

## 2015-11-09 DIAGNOSIS — F430 Acute stress reaction: Principal | ICD-10-CM

## 2015-11-09 MED ORDER — CITALOPRAM HYDROBROMIDE 10 MG PO TABS: 10.0000 mg | ORAL_TABLET | Freq: Every day | ORAL | Status: DC

## 2015-11-09 MED ORDER — ALPRAZOLAM 0.5 MG PO TABS: 0.5000 mg | ORAL_TABLET | Freq: Three times a day (TID) | ORAL | Status: DC | PRN

## 2015-11-09 MED ORDER — BUPROPION HCL ER (XL) 150 MG PO TB24: 450.0000 mg | ORAL_TABLET | Freq: Every day | ORAL | Status: AC

## 2015-11-09 NOTE — Patient Instructions (Signed)
Stay with   Counselors and may need to see psychiatry also to help.  Add  Low dose celexa   To your current meds  .  ROV in 3-4 weeks .

## 2015-11-30 ENCOUNTER — Telehealth: Payer: Self-pay | Admitting: Internal Medicine

## 2015-11-30 NOTE — Telephone Encounter (Signed)
Need to know the name of the rx

## 2015-11-30 NOTE — Telephone Encounter (Signed)
Pt request refill of the following:   Phamacy:  [facility] [address]

## 2015-12-01 ENCOUNTER — Other Ambulatory Visit: Payer: Self-pay | Admitting: Internal Medicine

## 2015-12-01 NOTE — Telephone Encounter (Signed)
Pt need new Rx for alprazolam   °

## 2015-12-02 NOTE — Telephone Encounter (Signed)
See medicine request   Response (   Ok x1)

## 2015-12-02 NOTE — Telephone Encounter (Signed)
Done

## 2015-12-02 NOTE — Telephone Encounter (Signed)
Pt calling about Rx

## 2015-12-02 NOTE — Telephone Encounter (Signed)
Sent to the pharmacy by e-scribe. 

## 2015-12-02 NOTE — Telephone Encounter (Signed)
Ok to refill  Med x 1 had appt coming up

## 2015-12-08 ENCOUNTER — Ambulatory Visit: Payer: Managed Care, Other (non HMO) | Admitting: Internal Medicine

## 2015-12-29 ENCOUNTER — Telehealth: Payer: Self-pay

## 2015-12-29 DIAGNOSIS — R0789 Other chest pain: Secondary | ICD-10-CM

## 2015-12-29 NOTE — Telephone Encounter (Signed)
DOD call received from [facility] Dr.Loehr. Dr.Smith and Dr.Loehr discussed the pt's symptoms, she will need a gxt. Adv Dr.Loehr's nurse that we will call the pt directly to schedule the gxt.

## 2015-12-30 ENCOUNTER — Encounter (HOSPITAL_COMMUNITY): Payer: Self-pay | Admitting: *Deleted

## 2015-12-30 ENCOUNTER — Emergency Department (HOSPITAL_COMMUNITY)
Admission: EM | Admit: 2015-12-30 | Discharge: 2015-12-30 | Disposition: A | Discharge status: Home / Self Care | Payer: Managed Care, Other (non HMO) | Attending: Emergency Medicine | Admitting: Emergency Medicine

## 2015-12-30 ENCOUNTER — Emergency Department (HOSPITAL_COMMUNITY): Payer: Managed Care, Other (non HMO)

## 2015-12-30 DIAGNOSIS — F329 Major depressive disorder, single episode, unspecified: Secondary | ICD-10-CM | POA: Insufficient documentation

## 2015-12-30 DIAGNOSIS — E785 Hyperlipidemia, unspecified: Secondary | ICD-10-CM | POA: Insufficient documentation

## 2015-12-30 DIAGNOSIS — R079 Chest pain, unspecified: Secondary | ICD-10-CM

## 2015-12-30 DIAGNOSIS — Z87891 Personal history of nicotine dependence: Secondary | ICD-10-CM | POA: Diagnosis not present

## 2015-12-30 LAB — CBC
HEMATOCRIT: 41.6 % (ref 36.0–46.0)
HEMOGLOBIN: 14.5 g/dL (ref 12.0–15.0)
MCH: 31.7 pg (ref 26.0–34.0)
MCHC: 34.9 g/dL (ref 30.0–36.0)
MCV: 91 fL (ref 78.0–100.0)
Platelets: 363 10*3/uL (ref 150–400)
RBC: 4.57 MIL/uL (ref 3.87–5.11)
RDW: 12.3 % (ref 11.5–15.5)
WBC: 6.3 10*3/uL (ref 4.0–10.5)

## 2015-12-30 LAB — BASIC METABOLIC PANEL
ANION GAP: 10 (ref 5–15)
BUN: 16 mg/dL (ref 6–20)
CO2: 24 mmol/L (ref 22–32)
Calcium: 9.3 mg/dL (ref 8.9–10.3)
Chloride: 99 mmol/L — ABNORMAL LOW (ref 101–111)
Creatinine, Ser: 0.75 mg/dL (ref 0.44–1.00)
GFR calc Af Amer: >60 mL/min (ref >60)
GLUCOSE: 101 mg/dL — AB (ref 65–99)
POTASSIUM: 3.6 mmol/L (ref 3.5–5.1)
Sodium: 133 mmol/L — ABNORMAL LOW (ref 135–145)

## 2015-12-30 LAB — I-STAT TROPONIN, ED
TROPONIN I, POC: 0 ng/mL (ref 0.00–0.08)
Troponin i, poc: 0 ng/mL (ref 0.00–0.08)

## 2015-12-30 NOTE — ED Provider Notes (Signed)
CSN: [id number]     Arrival date & time 12/30/15  1330 History   First MD Initiated Contact with Patient 12/30/15 1348     Chief Complaint  Patient presents with  . Chest Pain     (Consider location/radiation/quality/duration/timing/severity/associated sxs/prior Treatment) Patient is a 55 y.o. female presenting with chest pain. The history is provided by the patient and medical records.  Chest Pain Associated symptoms: nausea     55 y.o. F with hx fo UTI, depression, GERD, HLP, presenting to the ED for chest pain.  Patient states this initially began 3 days ago.  She states initially it was just some dull pain on her right side of her chest and a burning sensation when breathing in cold air during her meditation/yoga. States this was mild initially and she attributed it to her anxiety so took some of her xanax with relief.  The next day pain seemed worse and more generalized throughout her chest but still without associated symptoms.  She states She was evaluated at urgent care with negative EKG and lab work. She states she was referred for an outpatient stress test, however she was uncomfortable with that evaluation she received so she decided she wanted to be seen again. She states today while walking her dog she developed some pain in her left shoulder. Denies any neck pain, shortness of breath, dizziness, or diaphoresis. She does admit she has been picking up her dog frequently over the past few days so unsure if this is contributing. States once returning home she bent over at the sink while washing dishes and had worsening pain in the left side of her chest so she decided to come to the ED. States she feels like something is just not settling right in her chest.  Patient states her chest is tender to the touch.  She denies known cardiac hx.  Family cardiac hx-- father had MI x2, first at age 58.  Paternal grandfather also had MI at age 87.  She is a former smoker, quit 5 years ago.  States she  took 325 ASA this morning.  No recent cough, fever, or other URI symptoms.  VSS.  Past Medical History  Diagnosis Date  . UTI (lower urinary tract infection)   . Depression     on meds under care stable psych  and counserlor [place]  . 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 Hyperlipidemia     on meds and weight loss help / orig 250 and above reange by report   Past Surgical History  Procedure Laterality Date  . Cholecystectomy  Dec 19, 2000  . Bunionectomy  10/04/14  . Biopsy breast  1988   Family History  Problem Relation Age of Onset  . Breast cancer Sister     Deceased 2004/12/19 age 25  . Lung cancer Father     mets to brain  . Hyperlipidemia Mother   . Alzheimer's disease Mother   . Heart disease Father     Heart attack  . Hypertension Mother   . Hypertension Brother    Social History  Substance Use Topics  . Smoking status: Former Games developer  . Smokeless tobacco: Never Used  . Alcohol Use: 0.0 oz/week    0 Standard drinks or equivalent per week   OB History    Gravida Para Term Preterm AB TAB SAB Ectopic Multiple Living   1  Review of Systems  Cardiovascular: Positive for chest pain.  Gastrointestinal: Positive for nausea.  All other systems reviewed and are negative.     Allergies  Amoxicillin; Ciprofloxacin; and Sulfa antibiotics  Home Medications   Prior to Admission medications   Medication Sig Start Date End Date Taking? Authorizing Provider  ALPRAZolam (XANAX) 0.5 MG tablet TAKE 1 TABLET BY MOUTH 3 TIMES DAILY AS NEEDED FOR ANXIETY 12/02/15   Madelin HeadingsWanda K Panosh, MD  Astaxanthin 4 MG CAPS Take 1 capsule by mouth daily.    Historical Provider, MD  atorvastatin (LIPITOR) 40 MG tablet TAKE 1 TABLET (40 MG TOTAL) BY MOUTH DAILY. 08/08/15   Madelin HeadingsWanda K Panosh, MD  buPROPion (WELLBUTRIN XL) 150 MG 24 hr tablet Take 3 tablets (450 mg total) by mouth daily. 11/09/15   Madelin HeadingsWanda K Panosh, MD  citalopram (CELEXA) 10 MG  tablet Take 1 tablet (10 mg total) by mouth daily. 11/09/15   Madelin HeadingsWanda K Panosh, MD  pantoprazole (PROTONIX) 40 MG tablet Take 1 tablet (40 mg total) by mouth daily. 07/04/15   Madelin HeadingsWanda K Panosh, MD  valACYclovir (VALTREX) 500 MG tablet Take 500 mg by mouth 2 (two) times daily.    Historical Provider, MD   BP 136/60 mmHg  Pulse 82  Temp(Src) 99.1 F (37.3 C) (Oral)  Resp 14  SpO2 100%   Physical Exam  Constitutional: She is oriented to person, place, and time. She appears well-developed and well-nourished. No distress.  HENT:  Head: Normocephalic and atraumatic.  Mouth/Throat: Oropharynx is clear and moist.  Eyes: Conjunctivae and EOM are normal. Pupils are equal, round, and reactive to light.  Neck: Normal range of motion.  Cardiovascular: Normal rate, regular rhythm and normal heart sounds.   Pulmonary/Chest: Effort normal and breath sounds normal. No respiratory distress. She has no wheezes.  Left lower chest wall TTP without noted deformity or signs of trauma; lungs clear  Abdominal: Soft. Bowel sounds are normal.  Musculoskeletal: Normal range of motion.  Neurological: She is alert and oriented to person, place, and time.  Skin: Skin is warm and dry. She is not diaphoretic.  Psychiatric: Her mood appears anxious.  Appears anxious  Nursing note and vitals reviewed.   ED Course  Procedures (including critical care time) Labs Review Labs Reviewed  BASIC METABOLIC PANEL - Abnormal; Notable for the following:    Sodium 133 (*)    Chloride 99 (*)    Glucose, Bld 101 (*)    All other components within normal limits  CBC  I-STAT TROPOININ, ED    Imaging Review Dg Chest 2 View  12/30/2015  CLINICAL DATA:  Chest pain. EXAM: CHEST  2 VIEW COMPARISON:  None. FINDINGS: Cardiomediastinal silhouette is normal. Mediastinal contours appear intact. There is no evidence of focal airspace consolidation, pleural effusion or pneumothorax. Osseous structures are without acute abnormality. Soft  tissues are grossly normal. IMPRESSION: No active cardiopulmonary disease. Electronically Signed   By: Ted Mcalpineobrinka  Dimitrova M.D.   On: 12/30/2015 14:30   I have personally reviewed and evaluated these images and lab results as part of my medical decision-making.   EKG Interpretation None      MDM   Final diagnoses:  Chest pain, unspecified chest pain type   55 year old female here with chest pain which is been intermittent for the past 3 days. Based on her story, her symptoms seem atypical in nature. She has no personal cardiac history, she does have family history and is a former smoker. She was evaluated yesterday  at urgent care with a negative workup. She was scheduled for an outpatient stress test on Tuesday, however decided to be reevaluated today as she had concerns about the urgent care facility. On exam today she does appear anxious.  Her pain is reproducible on exam.  VSS.  Her EKG today is NSR without any acute ischemic changes. Her chest x-ray is clear. Her lab work including initial and delto troponin are negative. Patient's pain continues to be reproducible on exam. She does admit to recently starting meditation and yoga and has been picking up her dog frequently. Question if this is musculoskeletal. Her anxiety may also be playing a role.  I have lower suspicion for ACS, PE, dissection, or other acute cardiac event at this time. Patient heart score is low at 3.  Feel patient is stable for discharge.  Encouraged to follow-up with cardiology on Tuesday for her stress test.  Discussed plan with patient, he/she acknowledged understanding and agreed with plan of care.  Return precautions given for new or worsening symptoms.  Case discussed with attending physician, Dr. Estell Harpin, who agrees with assessment and plan of care.   Garlon Hatchet, PA-C 12/30/15 1809  Bethann Berkshire, MD 12/30/15 [phone]

## 2015-12-30 NOTE — ED Notes (Addendum)
Pt complains of central chest/left shoulder blade pain since 12PM today. Pt states the pain started when she stood up from the couch. Pt went to urgent care yesterday for same, was told to get stress test. Pt states she felt mild nausea while driving to hospital. Pt took 324 aspirin at 9am this morning.

## 2015-12-30 NOTE — Discharge Instructions (Signed)
Continue your regular home medications. Follow-up with cardiology on Tuesday for your previously scheduled stress test. Return to the ED for new or worsening symptoms-- worsening pain, shortness of breath, sweating, nausea, vomiting, dizziness, weakness, etc.

## 2016-01-02 ENCOUNTER — Ambulatory Visit: Payer: Managed Care, Other (non HMO) | Admitting: Internal Medicine

## 2016-01-03 ENCOUNTER — Ambulatory Visit (INDEPENDENT_AMBULATORY_CARE_PROVIDER_SITE_OTHER): Payer: Managed Care, Other (non HMO)

## 2016-01-03 DIAGNOSIS — R0789 Other chest pain: Secondary | ICD-10-CM | POA: Diagnosis not present

## 2016-01-03 LAB — EXERCISE TOLERANCE TEST
CHL RATE OF PERCEIVED EXERTION: 17
CSEPED: 6 min
CSEPEW: 7 METS
CSEPHR: 98 %
Exercise duration (sec): 0 s
MPHR: 98 {beats}/min
Peak HR: 162 {beats}/min
Rest HR: 83 {beats}/min

## 2016-01-04 ENCOUNTER — Telehealth: Payer: Self-pay | Admitting: Gastroenterology

## 2016-01-04 NOTE — Telephone Encounter (Signed)
Care everywhere note from [facility] 12/02/2012 Assessment:  Angela Pittman reports having GERD for greater then 5 years with break through acid despite PPI therapy. I have asked that she increase PPI to BID and follow a strict Reflux diet. She works at a coffee shop and is non-complaint with dietary recommendations. She will also resume exercise as she has had recent weight gain. She will schedule EGD for evaluation.   She will start Miralax 1/2 capful daily for constipation and titrate the dose for regular bowel movements. She will also attempt a high fiber diet and increase the amount of water she drinks daily. She will schedule colonoscopy. She has a family history of colon cancer.  Plan:   1. Continue Protonix 40 mg twice daily, take 30-60 minutes before and dinner daily  2. Start Reflux diet, avoid greasy/ fatty foods, chocolate, peppermint, coffee/tea/ soda and caffeinated beverages, no late night eating ,elevate head of bed  3. Schedule Upper Endoscopy   4. Start 1/2 capful of Miralax daily,mix with 8-10 oz glass or water for constipation  5. Start High fiber diet, eat fruits and vegetables and drink water throughout the day (greater then 64 oz)  6. Schedule Colonoscopy  I personally performed the service.   Brand MalesSarah M Coppolino PA-C  [facility]  Spoke with patient. She states she called her former GI doctor in [place]. They are able to see her today. She is on her way there now.

## 2016-01-05 ENCOUNTER — Telehealth: Payer: Self-pay | Admitting: Interventional Cardiology

## 2016-01-05 NOTE — Telephone Encounter (Signed)
Pt has not been seen by one of our cardiologist, she only had a gxt done here. gxt was ordered with Dr.Smith as the provider after receiving a DOD call from a urgent care facility where the pt was being seen. Spoke with Kim in our [facility] dept. Selena BattenKim will f/u with the pt to provide her a copy of her gxt.

## 2016-01-05 NOTE — Telephone Encounter (Signed)
New message      The pt is calling to get the results of her stress, the pt usually does not see a cardiologist but the pt needs the results so she can have another test done.

## 2016-01-06 NOTE — Telephone Encounter (Signed)
Called pt and gave her GXT results via phone, pt verbalizes understanding and requests that results be mailed to her. Pt has been unable to activate myChart.

## 2016-01-06 NOTE — Telephone Encounter (Signed)
This encounter was created in error - please disregard.

## 2016-01-10 ENCOUNTER — Other Ambulatory Visit: Payer: Self-pay | Admitting: Internal Medicine

## 2016-01-10 NOTE — Telephone Encounter (Signed)
Can refill x 1  OV before further refills  

## 2016-01-11 NOTE — Telephone Encounter (Signed)
Called to the pharmacy by e-scribe.  Pt notified she will need appointment.

## 2016-02-05 ENCOUNTER — Other Ambulatory Visit: Payer: Self-pay | Admitting: Internal Medicine

## 2016-02-07 NOTE — Telephone Encounter (Signed)
Sent to the pharmacy by e-scribe. 

## 2016-09-13 ENCOUNTER — Other Ambulatory Visit: Payer: Self-pay | Admitting: Family Medicine

## 2016-09-13 DIAGNOSIS — R5381 Other malaise: Secondary | ICD-10-CM

## 2016-09-19 ENCOUNTER — Other Ambulatory Visit: Payer: Self-pay | Admitting: Family Medicine

## 2016-09-19 DIAGNOSIS — E2839 Other primary ovarian failure: Secondary | ICD-10-CM

## 2016-12-22 ENCOUNTER — Other Ambulatory Visit: Payer: Self-pay | Admitting: Internal Medicine

## 2016-12-24 NOTE — Telephone Encounter (Signed)
Pt is over due for an annual exam/OV. Sending in a 30 day supply of statin medication. Please help patient make an appointment. Thank you

## 2016-12-25 NOTE — Telephone Encounter (Signed)
lmom for pt to call back

## 2016-12-31 NOTE — Telephone Encounter (Signed)
Pt has new PCP

## 2017-04-12 ENCOUNTER — Encounter: Payer: Self-pay | Admitting: Internal Medicine

## 2017-04-25 IMAGING — CR DG CHEST 2V
2 series · 2 of 2 positions shown · non-contrast
Comparison: None.

CLINICAL DATA: Chest pain.

EXAM:
CHEST  2 VIEW

[w chest pa]
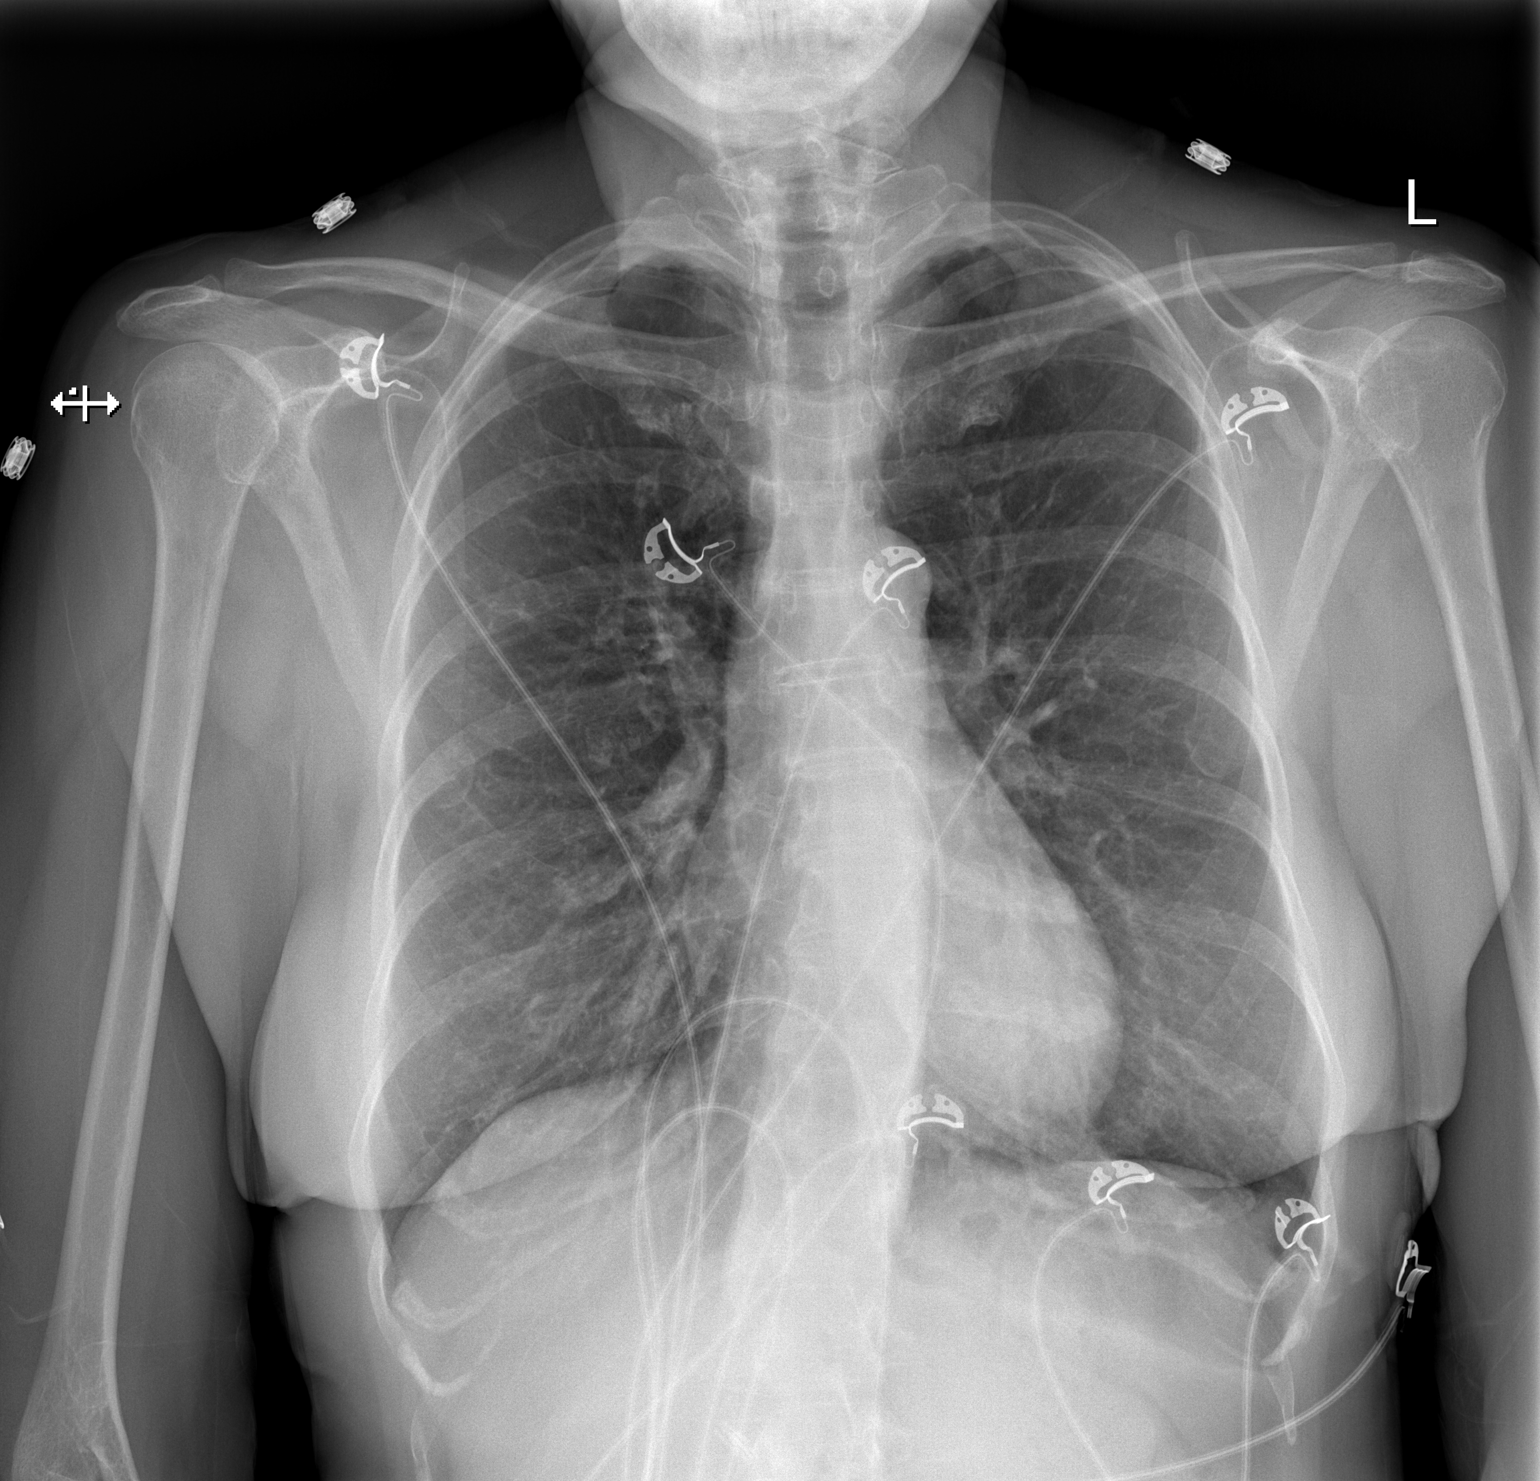

[w chest lat]
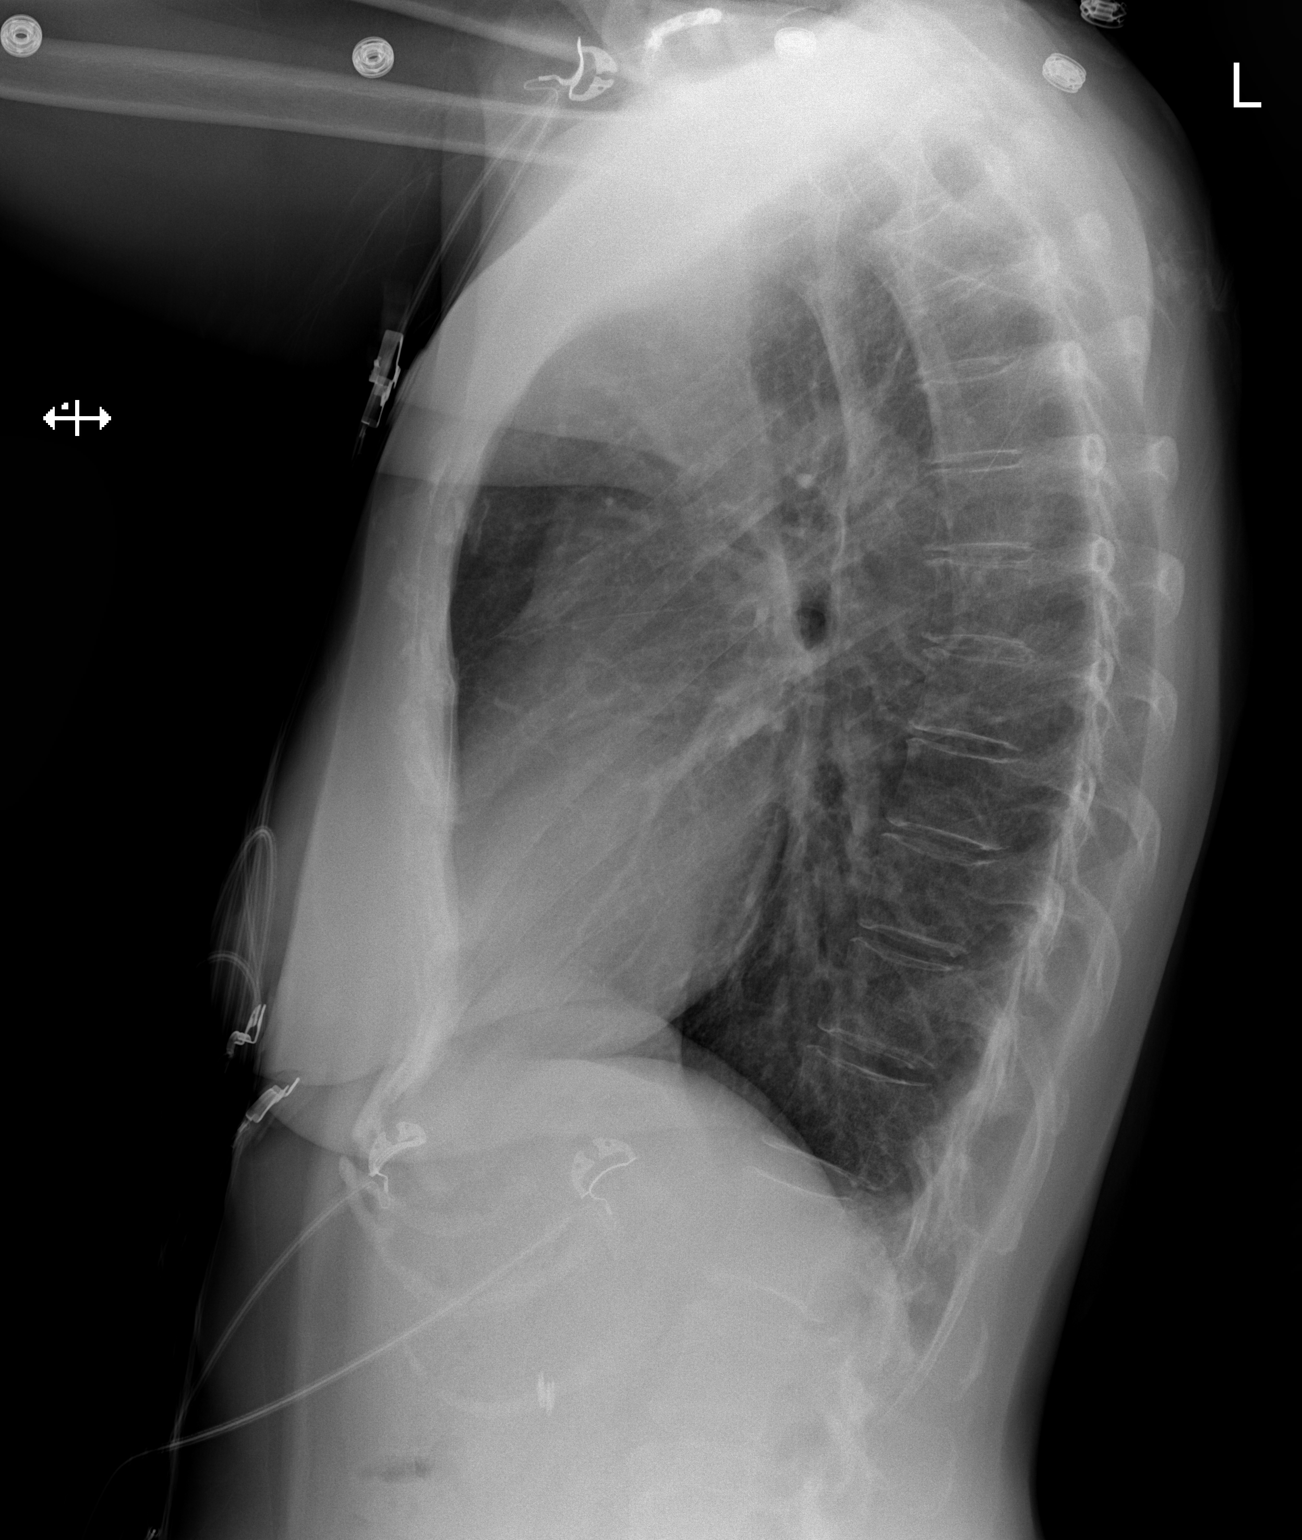

[2 of 2 positions shown; findings below may reference images not displayed]

FINDINGS: Cardiomediastinal silhouette is normal. Mediastinal contours appear
intact.

There is no evidence of focal airspace consolidation, pleural
effusion or pneumothorax.

Osseous structures are without acute abnormality. Soft tissues are
grossly normal.
IMPRESSION: No active cardiopulmonary disease.

## 2018-05-01 ENCOUNTER — Ambulatory Visit (INDEPENDENT_AMBULATORY_CARE_PROVIDER_SITE_OTHER): Payer: 59 | Admitting: Psychiatry

## 2018-05-01 ENCOUNTER — Encounter: Payer: Self-pay | Admitting: Psychiatry

## 2018-05-01 DIAGNOSIS — F4310 Post-traumatic stress disorder, unspecified: Secondary | ICD-10-CM | POA: Diagnosis not present
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
## 2018-05-15 ENCOUNTER — Ambulatory Visit: Payer: 59 | Admitting: Psychiatry

## 2018-05-20 ENCOUNTER — Ambulatory Visit (INDEPENDENT_AMBULATORY_CARE_PROVIDER_SITE_OTHER): Payer: 59 | Admitting: Psychiatry

## 2018-05-20 DIAGNOSIS — F4310 Post-traumatic stress disorder, unspecified: Secondary | ICD-10-CM | POA: Diagnosis not present
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
## 2018-06-02 ENCOUNTER — Ambulatory Visit: Payer: Self-pay | Admitting: Psychiatry

## 2018-06-02 ENCOUNTER — Ambulatory Visit: Payer: 59 | Admitting: Psychiatry

## 2018-06-11 ENCOUNTER — Ambulatory Visit: Payer: 59 | Admitting: Psychiatry

## 2018-06-13 ENCOUNTER — Ambulatory Visit (INDEPENDENT_AMBULATORY_CARE_PROVIDER_SITE_OTHER): Payer: 59 | Admitting: Psychiatry

## 2018-06-13 ENCOUNTER — Encounter: Payer: Self-pay | Admitting: Psychiatry

## 2018-06-13 DIAGNOSIS — F4310 Post-traumatic stress disorder, unspecified: Secondary | ICD-10-CM | POA: Diagnosis not present
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
## 2018-06-16 ENCOUNTER — Ambulatory Visit: Payer: 59 | Admitting: Psychiatry

## 2018-06-16 ENCOUNTER — Ambulatory Visit (INDEPENDENT_AMBULATORY_CARE_PROVIDER_SITE_OTHER): Payer: 59 | Admitting: Psychiatry

## 2018-06-16 DIAGNOSIS — F4310 Post-traumatic stress disorder, unspecified: Secondary | ICD-10-CM
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
## 2018-06-24 ENCOUNTER — Encounter: Payer: Self-pay | Admitting: Psychiatry

## 2018-06-24 ENCOUNTER — Ambulatory Visit (INDEPENDENT_AMBULATORY_CARE_PROVIDER_SITE_OTHER): Payer: 59 | Admitting: Psychiatry

## 2018-06-24 DIAGNOSIS — F4310 Post-traumatic stress disorder, unspecified: Secondary | ICD-10-CM
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
## 2018-06-25 ENCOUNTER — Ambulatory Visit: Payer: 59 | Admitting: Psychiatry

## 2018-07-01 ENCOUNTER — Encounter

## 2018-07-02 ENCOUNTER — Ambulatory Visit (INDEPENDENT_AMBULATORY_CARE_PROVIDER_SITE_OTHER): Payer: 59 | Admitting: Psychiatry

## 2018-07-02 ENCOUNTER — Encounter: Payer: Self-pay | Admitting: Psychiatry

## 2018-07-02 DIAGNOSIS — F4310 Post-traumatic stress disorder, unspecified: Secondary | ICD-10-CM
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
## 2018-07-29 ENCOUNTER — Ambulatory Visit (INDEPENDENT_AMBULATORY_CARE_PROVIDER_SITE_OTHER): Payer: 59 | Admitting: Psychiatry

## 2018-07-29 ENCOUNTER — Encounter: Payer: Self-pay | Admitting: Psychiatry

## 2018-07-29 DIAGNOSIS — F4310 Post-traumatic stress disorder, unspecified: Secondary | ICD-10-CM | POA: Diagnosis not present
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
## 2018-08-11 ENCOUNTER — Encounter: Payer: Self-pay | Admitting: Psychiatry

## 2018-08-11 ENCOUNTER — Ambulatory Visit (INDEPENDENT_AMBULATORY_CARE_PROVIDER_SITE_OTHER): Payer: 59 | Admitting: Psychiatry

## 2018-08-11 DIAGNOSIS — F4310 Post-traumatic stress disorder, unspecified: Secondary | ICD-10-CM

## 2018-08-11 DIAGNOSIS — F411 Generalized anxiety disorder: Secondary | ICD-10-CM | POA: Diagnosis not present
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
## 2018-08-14 ENCOUNTER — Encounter: Payer: Self-pay | Admitting: Psychiatry

## 2018-08-26 ENCOUNTER — Telehealth: Payer: Self-pay | Admitting: Psychiatry

## 2018-08-26 NOTE — Telephone Encounter (Signed)
Patient stated she need to speak with you  concerning the insurance letter. Please call before 1pm today. Stated this is urgent.

## 2018-08-27 ENCOUNTER — Ambulatory Visit (INDEPENDENT_AMBULATORY_CARE_PROVIDER_SITE_OTHER): Payer: 59 | Admitting: Psychiatry

## 2018-08-27 ENCOUNTER — Encounter: Payer: Self-pay | Admitting: Psychiatry

## 2018-08-27 DIAGNOSIS — F4310 Post-traumatic stress disorder, unspecified: Secondary | ICD-10-CM | POA: Diagnosis not present
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
## 2018-09-01 ENCOUNTER — Telehealth: Payer: Self-pay | Admitting: Psychiatry

## 2018-09-01 ENCOUNTER — Ambulatory Visit: Payer: 59 | Admitting: Psychiatry

## 2018-09-01 NOTE — Telephone Encounter (Signed)
Patient called asking if the letter was ready for her to pick up  She needs it before Wednesday. Also she has a jury summons and she needs a letter to get out of that . She will need that before march 9 th

## 2018-09-19 ENCOUNTER — Ambulatory Visit (INDEPENDENT_AMBULATORY_CARE_PROVIDER_SITE_OTHER): Payer: 59 | Admitting: Psychiatry

## 2018-09-19 ENCOUNTER — Encounter: Payer: Self-pay | Admitting: Psychiatry

## 2018-09-19 DIAGNOSIS — F4310 Post-traumatic stress disorder, unspecified: Secondary | ICD-10-CM
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
## 2018-09-22 ENCOUNTER — Telehealth: Payer: Self-pay | Admitting: Psychiatry

## 2018-09-22 DIAGNOSIS — F4310 Post-traumatic stress disorder, unspecified: Secondary | ICD-10-CM

## 2018-09-22 DIAGNOSIS — F411 Generalized anxiety disorder: Secondary | ICD-10-CM

## 2018-09-22 MED ORDER — ALPRAZOLAM 0.5 MG PO TABS: 0.5000 mg | ORAL_TABLET | Freq: Three times a day (TID) | ORAL | 0 refills | Status: DC | PRN

## 2018-09-22 NOTE — Telephone Encounter (Signed)
Patient called and said that she wants to escribe her some xanax since her anxiety is high. She might need an appointment. [facility] [address].

## 2018-09-25 ENCOUNTER — Telehealth: Payer: Self-pay | Admitting: Psychiatry

## 2018-09-25 NOTE — Telephone Encounter (Signed)
Returned patient's phone call.  Discussed the progress notes being released.  Explained that therapy notes are protected information and that clinician was not in support of releasing them.  She agreed that she did not want them released so at this point nothing will occur concerning the notes.

## 2018-10-01 ENCOUNTER — Other Ambulatory Visit: Payer: Self-pay

## 2018-10-01 ENCOUNTER — Ambulatory Visit (INDEPENDENT_AMBULATORY_CARE_PROVIDER_SITE_OTHER): Payer: 59 | Admitting: Psychiatry

## 2018-10-01 ENCOUNTER — Encounter: Payer: Self-pay | Admitting: Psychiatry

## 2018-10-01 DIAGNOSIS — F4310 Post-traumatic stress disorder, unspecified: Secondary | ICD-10-CM | POA: Diagnosis not present

## 2018-10-01 NOTE — Progress Notes (Signed)
      [facility] Counselor/Therapist Progress Note  Patient ID: Angela Pittman, MRN: [id number],    Date: 10/01/2018  Time Spent: 51 minutes   Treatment Type: Individual Therapy  Reported Symptoms: anxiety, fatigue, flashbacks  Mental Status Exam:  Appearance:   Well Groomed     Behavior:  Appropriate  Motor:  Normal  Speech/Language:   Normal Rate  Affect:  Appropriate  Mood:  normal  Thought process:  normal  Thought content:    Rumination  Sensory/Perceptual disturbances:    Flashback  Orientation:  oriented to person, place, time/date and situation  Attention:  Good  Concentration:  Good  Memory:  WNL  Fund of knowledge:   Good  Insight:    Good  Judgment:   Good  Impulse Control:  Good   Risk Assessment: Danger to Self:  No Self-injurious Behavior: No Danger to Others: No Duty to Warn:no Physical Aggression / Violence:No  Access to Firearms a concern: No  Gang Involvement:No   Subjective: Patient was present for session.  She reported she is still struggling with her PTSD.  Pt explained that it has been impacting her at work and she has to figure out how to get it more under control.  Did EMDR set on the work situation.  SUDS level was 10, negative cognition "I don't matter", felt anger in her stomach.  Pt was able to reduce SUDS level to 3.  Pt was encouraged to figure out the self talk she needed to use to help her stay calmer and remember she is enough.  Pt was able to develop some plans she felt would help.   Interventions: Cognitive Behavioral Therapy and Solution-Oriented/Positive Psychology,EMDR  Diagnosis:   ICD-10-CM   1. PTSD (post-traumatic stress disorder) F43.10     Plan: 1.  Patient to continue to engage in individual counseling 2-4 times a month or as needed. 2.  Patient to identify and apply CBT, coping skills learned in session to decrease triggered responses and anxiety symptoms. 3.  Patient to contact this office, go to the local ED or call  911 if a crisis or emergency develops between visits.  Stevphen Meuse, [place]      This record has been created using [employer].  Chart creation errors have been sought, but may not always have been located and corrected. Such creation errors do not reflect on the standard of medical care.

## 2018-10-15 ENCOUNTER — Ambulatory Visit: Payer: 59 | Admitting: Psychiatry

## 2018-11-07 ENCOUNTER — Ambulatory Visit: Payer: 59 | Admitting: Psychiatry

## 2018-12-08 ENCOUNTER — Ambulatory Visit: Payer: 59 | Admitting: Psychiatry

## 2018-12-22 ENCOUNTER — Ambulatory Visit: Payer: 59 | Admitting: Psychiatry

## 2019-02-13 ENCOUNTER — Telehealth: Payer: Self-pay | Admitting: Psychiatry

## 2019-02-13 ENCOUNTER — Other Ambulatory Visit: Payer: Self-pay | Admitting: Psychiatry

## 2019-02-13 NOTE — Telephone Encounter (Signed)
Pt needs refill on Bupropion 180 tablets sent in to [facility] on [address].

## 2019-02-13 NOTE — Telephone Encounter (Signed)
Need to review paper chart not seen in epic  

## 2019-05-10 ENCOUNTER — Other Ambulatory Visit: Payer: Self-pay | Admitting: Psychiatry

## 2019-05-18 ENCOUNTER — Telehealth: Payer: Self-pay | Admitting: Psychiatry

## 2019-05-18 NOTE — Telephone Encounter (Signed)
Returned patient's call.  She shared that she is moving to [place].  Wished her the best agreed to see if there is someone to refer her to in that area.

## 2019-05-18 NOTE — Telephone Encounter (Signed)
Trying to see her house here in [place]. Will be moving to [place], [place].  Thanks!

## 2019-06-06 ENCOUNTER — Other Ambulatory Visit: Payer: Self-pay | Admitting: Psychiatry

## 2019-06-07 NOTE — Telephone Encounter (Signed)
Can you clarify what patient is taking and if she is still seeing Janett Billow?

## 2019-06-08 NOTE — Telephone Encounter (Signed)
Left pt. A vm to return my call.

## 2019-06-09 NOTE — Telephone Encounter (Signed)
Left pt. Another Vm to return my call.

## 2019-06-10 ENCOUNTER — Other Ambulatory Visit: Payer: Self-pay

## 2019-06-10 DIAGNOSIS — F411 Generalized anxiety disorder: Secondary | ICD-10-CM

## 2019-06-10 DIAGNOSIS — F4310 Post-traumatic stress disorder, unspecified: Secondary | ICD-10-CM

## 2019-06-10 NOTE — Telephone Encounter (Signed)
Pt. Takes the Wellbutrin XL not the SR. She is still seeing Janett Billow. She has not moved yet. Pt. States she does need a refill on her Xanax. She very rarely takes it but would like to have some just in case she needs it. She is getting ready to move to [facility] and does not want to move without at least one refill and she will look for a new provider after when she is settled.

## 2019-06-10 NOTE — Telephone Encounter (Signed)
Noted, will update Angela Pittman.

## 2019-06-11 MED ORDER — ALPRAZOLAM 0.5 MG PO TABS: 0.5000 mg | ORAL_TABLET | Freq: Three times a day (TID) | ORAL | 0 refills | Status: AC | PRN
